# Patient Record
Sex: Female | Born: 1950 | Race: Black or African American | Hispanic: No | Marital: Married | Smoking: Never smoker
Health system: Southern US, Community
[De-identification: ages and names within clinical notes are randomized; demographics above are authoritative.]

## PROBLEM LIST (undated history)

## (undated) DIAGNOSIS — G43909 Migraine, unspecified, not intractable, without status migrainosus: Secondary | ICD-10-CM

## (undated) DIAGNOSIS — E7800 Pure hypercholesterolemia, unspecified: Secondary | ICD-10-CM

## (undated) DIAGNOSIS — E119 Type 2 diabetes mellitus without complications: Secondary | ICD-10-CM

## (undated) HISTORY — PX: ABDOMINAL HYSTERECTOMY: SHX81

## (undated) HISTORY — PX: CERVICAL DISC SURGERY: SHX588

## (undated) HISTORY — DX: Migraine, unspecified, not intractable, without status migrainosus: G43.909

## (undated) HISTORY — DX: Type 2 diabetes mellitus without complications: E11.9

## (undated) HISTORY — DX: Pure hypercholesterolemia, unspecified: E78.00

---

## 1999-03-22 ENCOUNTER — Encounter: Admission: RE | Admit: 1999-03-22 | Discharge: 1999-03-22 | Payer: Self-pay | Admitting: Family Medicine

## 1999-03-22 ENCOUNTER — Encounter: Payer: Self-pay | Admitting: Family Medicine

## 1999-05-29 ENCOUNTER — Encounter: Payer: Self-pay | Admitting: Neurosurgery

## 1999-05-31 ENCOUNTER — Inpatient Hospital Stay (HOSPITAL_COMMUNITY): Admission: RE | Admit: 1999-05-31 | Discharge: 1999-06-04 | Payer: Self-pay | Admitting: Neurosurgery

## 1999-05-31 ENCOUNTER — Encounter: Payer: Self-pay | Admitting: Neurosurgery

## 1999-06-02 ENCOUNTER — Encounter: Payer: Self-pay | Admitting: Neurosurgery

## 1999-06-15 ENCOUNTER — Encounter: Admission: RE | Admit: 1999-06-15 | Discharge: 1999-06-15 | Payer: Self-pay | Admitting: Neurosurgery

## 1999-06-15 ENCOUNTER — Encounter: Payer: Self-pay | Admitting: Neurosurgery

## 1999-08-24 ENCOUNTER — Encounter: Payer: Self-pay | Admitting: Neurosurgery

## 1999-08-24 ENCOUNTER — Encounter: Admission: RE | Admit: 1999-08-24 | Discharge: 1999-08-24 | Payer: Self-pay | Admitting: Neurosurgery

## 2001-05-06 ENCOUNTER — Encounter: Payer: Self-pay | Admitting: Family Medicine

## 2001-05-06 ENCOUNTER — Encounter: Admission: RE | Admit: 2001-05-06 | Discharge: 2001-05-06 | Payer: Self-pay | Admitting: Family Medicine

## 2001-07-15 ENCOUNTER — Encounter: Payer: Self-pay | Admitting: Family Medicine

## 2001-07-15 ENCOUNTER — Encounter: Admission: RE | Admit: 2001-07-15 | Discharge: 2001-07-15 | Payer: Self-pay | Admitting: Family Medicine

## 2002-04-13 ENCOUNTER — Other Ambulatory Visit: Admission: RE | Admit: 2002-04-13 | Discharge: 2002-04-13 | Payer: Self-pay | Admitting: Diagnostic Radiology

## 2002-10-27 ENCOUNTER — Encounter: Admission: RE | Admit: 2002-10-27 | Discharge: 2002-10-27 | Payer: Self-pay | Admitting: Family Medicine

## 2002-10-27 ENCOUNTER — Encounter: Payer: Self-pay | Admitting: Family Medicine

## 2002-12-01 ENCOUNTER — Encounter: Payer: Self-pay | Admitting: Family Medicine

## 2002-12-01 ENCOUNTER — Encounter: Admission: RE | Admit: 2002-12-01 | Discharge: 2002-12-01 | Payer: Self-pay | Admitting: Family Medicine

## 2002-12-01 ENCOUNTER — Encounter: Admission: RE | Admit: 2002-12-01 | Discharge: 2003-03-01 | Payer: Self-pay | Admitting: Family Medicine

## 2003-01-17 ENCOUNTER — Ambulatory Visit (HOSPITAL_COMMUNITY): Admission: RE | Admit: 2003-01-17 | Discharge: 2003-01-17 | Payer: Self-pay | Admitting: Gastroenterology

## 2003-10-07 ENCOUNTER — Other Ambulatory Visit: Admission: RE | Admit: 2003-10-07 | Discharge: 2003-10-07 | Payer: Self-pay | Admitting: Diagnostic Radiology

## 2004-04-12 ENCOUNTER — Encounter: Admission: RE | Admit: 2004-04-12 | Discharge: 2004-04-12 | Payer: Self-pay | Admitting: Family Medicine

## 2004-05-14 ENCOUNTER — Encounter (INDEPENDENT_AMBULATORY_CARE_PROVIDER_SITE_OTHER): Payer: Self-pay | Admitting: *Deleted

## 2004-05-14 ENCOUNTER — Ambulatory Visit (HOSPITAL_COMMUNITY): Admission: RE | Admit: 2004-05-14 | Discharge: 2004-05-15 | Payer: Self-pay | Admitting: General Surgery

## 2004-07-30 ENCOUNTER — Encounter (HOSPITAL_COMMUNITY): Admission: RE | Admit: 2004-07-30 | Discharge: 2004-10-28 | Payer: Self-pay | Admitting: Endocrinology

## 2004-10-26 ENCOUNTER — Encounter: Admission: RE | Admit: 2004-10-26 | Discharge: 2004-10-26 | Payer: Self-pay | Admitting: Endocrinology

## 2004-11-30 ENCOUNTER — Encounter: Admission: RE | Admit: 2004-11-30 | Discharge: 2004-11-30 | Payer: Self-pay | Admitting: Endocrinology

## 2005-07-16 ENCOUNTER — Encounter: Admission: RE | Admit: 2005-07-16 | Discharge: 2005-07-16 | Payer: Self-pay | Admitting: Family Medicine

## 2005-08-14 ENCOUNTER — Encounter: Admission: RE | Admit: 2005-08-14 | Discharge: 2005-08-14 | Payer: Self-pay | Admitting: Endocrinology

## 2006-03-12 IMAGING — NM NM RAI THERAPY CANCER THYROID
1 series · 1 of 1 positions shown · non-contrast
Comparison: none

CLINICAL DATA: 53 year old; thyroid cancer.
 NUCLEAR MEDICINE RADIOACTIVE IODINE THERAPY FOR THYROID CANCER:
 The patient has a history of papillary thyroid cancer and had a complete thyroidectomy on 05/14/04.  Whole body D-7S7 scan showed residual activity in the thyroid bed.  No metastatic disease.  The patient was subsequently treated with 100 millicuries of D-7S7 orally.  Appropriate precautions and instructions were reviewed with the patient.   The patient is to return for a 10 day post-treatment follow-up whole body D-7S7 scan.

[Series 1: st static image · 1.61mm/px · 1 of 1 slices shown]
[im 1/1]
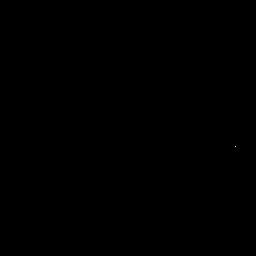

[1 of 1 positions shown; findings below may reference images not displayed]

IMPRESSION: Treatment for thyroid cancer with 100 millicuries of D-7S7.

## 2006-08-20 ENCOUNTER — Encounter: Admission: RE | Admit: 2006-08-20 | Discharge: 2006-08-20 | Payer: Self-pay | Admitting: Endocrinology

## 2006-12-17 ENCOUNTER — Encounter: Admission: RE | Admit: 2006-12-17 | Discharge: 2006-12-17 | Payer: Self-pay | Admitting: Family Medicine

## 2007-07-29 ENCOUNTER — Encounter: Admission: RE | Admit: 2007-07-29 | Discharge: 2007-07-29 | Payer: Self-pay | Admitting: Family Medicine

## 2008-04-14 ENCOUNTER — Encounter: Admission: RE | Admit: 2008-04-14 | Discharge: 2008-04-14 | Payer: Self-pay | Admitting: Family Medicine

## 2008-12-22 ENCOUNTER — Encounter: Admission: RE | Admit: 2008-12-22 | Discharge: 2008-12-22 | Payer: Self-pay | Admitting: Otolaryngology

## 2009-07-31 ENCOUNTER — Telehealth: Payer: Self-pay | Admitting: Internal Medicine

## 2009-08-29 ENCOUNTER — Encounter: Admission: RE | Admit: 2009-08-29 | Discharge: 2009-08-29 | Payer: Self-pay | Admitting: Family Medicine

## 2009-09-12 ENCOUNTER — Encounter: Admission: RE | Admit: 2009-09-12 | Discharge: 2009-09-12 | Payer: Self-pay | Admitting: Gastroenterology

## 2010-04-29 ENCOUNTER — Encounter: Payer: Self-pay | Admitting: Endocrinology

## 2010-05-10 NOTE — Progress Notes (Signed)
  Phone Note Outgoing Call   Call placed by: Milford Cage NCMA,  July 31, 2009 11:12 AM Call placed to: Patient Summary of Call: Called patient to find out why she was switching from Dr. Ewing Schlein and she states that her PCP referred her to [place]. She wants to stay with Dr. Ewing Schlein and will have Dr. Malen Gauze office call and make an appt. with him.  Appt. was cxl'd for 08/03/09 Milford Cage NCMA  July 31, 2009 11:13 AM

## 2010-08-24 NOTE — Op Note (Signed)
   Natalie Vincent, Natalie Vincent                 ACCOUNT NO.:  [id number]   MEDICAL RECORD NO.:  [id number]                   PATIENT TYPE:  AMB   LOCATION:  ENDO                                 FACILITY:  [facility]   PHYSICIAN:  Petra Kuba, M.D.                 DATE OF BIRTH:  1951-03-22   DATE OF PROCEDURE:  01/17/2003  DATE OF DISCHARGE:                                 OPERATIVE REPORT   PROCEDURE:  Colonoscopy.   INDICATIONS:  Patient with multiple lower GI complaints, due for screening.  Consent was signed after risks, benefits, methods, and options thoroughly  discussed in the office.   MEDICINES USED:  Demerol 70 mg, Versed 8 mg.   DESCRIPTION OF PROCEDURE:  Rectal inspection pertinent for external  hemorrhoids, small.  Digital exam was negative.  The pediatric video  adjustable colonoscope was inserted, easily advanced to the level of the  splenic flexure.  Unfortunately, at that point the scope began to loop and  with rolling her on her back and various abdominal pressures, we were able  to advance to the cecum.  No obvious abnormality was seen on insertion.  The  cecum was identified by the appendiceal orifice and the ileocecal valve.  I  fact, the scope was advanced into the opening of the terminal ileum but  could not be advanced very deep into it.  A quick look there appeared  normal.  The scope was slowly withdrawn.  The prep was adequate.  There was  some liquid stool that required washing and suctioning but on slow  withdrawal through the colon, no polyps, tumors, masses, signs of colitis,  or diverticula were seen as we slowly withdrew back to the rectum.  Anorectal pull-through and retroflexion confirmed some small hemorrhoids.  The scope was inserted a short way up the left side of the colon, air was  suctioned, the scope removed.  The patient tolerated the procedure well.  There was no obvious immediate complication.   ENDOSCOPIC DIAGNOSES:  1.  Internal-external hemorrhoids.  2. Otherwise within normal limits to the terminal ileum.   PLAN:  Follow up p.r.n.  Yearly rectals and guaiacs per Dr. Modesto Charon.  Repeat  screening in five to 10 years.  Consideration of Miralax and antispasmodics  for her symptoms if they continue.  Possibly a good cleaning and reassurance  will help.                                               Petra Kuba, M.D.    MEM/MEDQ  D:  01/17/2003  T:  01/17/2003  Job:  [id number]   cc:   Thelma Barge P. Modesto Charon, M.D.  [address]  [place]  [place] [address]  Fax: [phone]

## 2010-08-24 NOTE — Discharge Summary (Signed)
[place]  Patient:    Natalie, Vincent                       MRN: [id number] Adm. Date:  [id number] Disc. Date: [id number] Attending:  Josie Saunders                           Discharge Summary  REASON FOR ADMISSION:  Cervical disk herniation with myelopathy, with cervical spinal stenosis and cervical myelopathy.  DISCHARGE DIAGNOSES:  Cervical disk herniation with myelopathy, with cervical spinal stenosis and cervical myelopathy.  Additionally, myalgias and myositis with goiter, dysphagia, hypoxia with postoperative nausea.  HISTORY OF ILLNESS AND HOSPITAL COURSE:  [name] is a 60 year old woman with cervical spondylosis and neck pain and stiffness, with upper extremity dysesthesias.  She was found to have cervical spondylosis and disk herniations with cervical spinal stenosis, most marked at C4-5, C5-6, and C6-7 levels. The patient was admitted to the hospital on same day as procedure basis on May 31, 1999, and underwent anterior cervical diskectomy and fusion with allograft bone grafting and anterior cervical plating at C4-5, C5-6, and C6-7 levels.  She did well with her surgery, although postoperatively she had interscapular and incisional pain.  She had a cough on February 23 and had mild upper respiratory and viral syndrome prior to surgery.  She had some mild hand intrinsic weakness on the right.  She was mobilized as tolerated.  On February 24, she noted some difficulty swallowing, and had an oxygen saturation of 89% on room air.  She was therefore put on supplemental oxygen with good oxygen saturation.  She had some postoperative nausea as well as dysphagia.  On February 25, the nausea was improved.  She had a chest x-ray which showed a minimal effusion.  She still had marginal saturations at 91% on room air.  She was having some dysuria and frequent urination, and was started on ciprofloxacin for urinary tract infection.   She was observed on February 25, and on February 26 was doing better.  She complained of a sore throat but her difficulty with swallowing appeared to be improved at that point.  Her saturation on room air at that point was 93%.  She was doing better and was felt to be in stable condition, and was discharged home.  DISCHARGE MEDICATIONS:  Include her preoperative medications along with: 1. Percocet 1 or 2 every four hours as needed for pain. 2. Valium 5 mg up to every six hours as needed for spasm. 3. Stool softener as needed.  ACTIVITY:  She was instructed to wear her collar, to engage in no heavy lifting, pulling, or driving.  Okay to shower, but not to soak her incision.  FOLLOW-UP:  In two weeks with Dr. Venetia Maxon, and call today for an appointment. DD:  06/29/99 TD:  06/29/99 Job: [id number]

## 2010-08-24 NOTE — Op Note (Signed)
NAMEJEANNIFER, DRAKEFORD     ACCOUNT NO.:  [id number]   MEDICAL RECORD NO.:  [id number]          PATIENT TYPE:  OIB   LOCATION:  NA                           FACILITY:  [facility]   PHYSICIAN:  Leonie Man, M.D.   DATE OF BIRTH:  03-28-1951   DATE OF PROCEDURE:  05/14/2004  DATE OF DISCHARGE:                                 OPERATIVE REPORT   PREOPERATIVE DIAGNOSIS:  Left thyroid nodule.   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   PROCEDURE:  Total thyroidectomy.   SURGEON:  Leonie Man, M.D.   ASSISTANT:  Gita Kudo, M.D.   ANESTHESIA:  General.   INDICATIONS FOR PROCEDURE:  The patient is a 60 year old woman who presents  with a nodule of the left thyroid. She underwent fine-needle aspiration  biopsy of this lesion which showed a follicular lesion, could not rule out  carcinoma. She comes to the operating room after the risks and potential  benefits of surgery had been fully discussed including the risks of  recurrent laryngeal nerve injury, hyperparathyroidism, bleeding and  infection and been fully discussed. All questions answered and consent  obtained. The patient is aware that total thyroidectomy is possible if  indeed she does have cancer of thyroid. She gives consent to surgery after  all questions have been answered.   PROCEDURE:  Following induction of satisfactory general anesthesia with the  patient positioned supinely, the neck was prepped and draped to be included  in the sterile operative field. A collar incision was made approximately two  fingerbreadths above the sternal notch, deepening this through skin and  subcutaneous tissue and transecting the platysma muscle from the anterior  border of the sternocleidomastoid muscles bilaterally. Superior flap was  raised up to the thyroid cartilage, inferior flaps carried down to the  sternal notch and the strap muscles are opened in the midline and retracted.  The  left thyroid lobe is rather large and bulky. This was mobilized from the  posterior recesses of the neck taking the middle thyroid vein. The inferior  pole of the thyroid is elevated and the lower pole vessels are taken between  clips and secured. Attention then turned to the upper pole where the upper  pole was then taken down and secured with ties of 2-0 silk. The thyroid was  then retracted medially to expose the region of the recurrent laryngeal  nerves. Thyroid was then dissected free from the neck maintaining hemostasis  with clips and electrocautery where appropriate, protecting the recurrent  laryngeal nerve throughout its entire course. One parathyroid gland was  definitely identified. I did not identify any parathyroid glands either on  the thyroid or other parathyroid glands in the neck. The thyroid was then  dissected free from off of the anterior portion of the trachea taking the  pyramidal and the isthmus over towards the left middle lobe.  The isthmus  was then transected between clamps and then secured with suture ligatures of  3-0 Vicryl. Frozen section was obtained on the left lobe which showed a 1.3  cm papillary carcinoma. Decision was then made to proceed with total  thyroidectomy.  The incision which had already been closed prior to return of  the frozen section was reopened and the right thyroid gland was then  exposed. Both the middle thyroid vein was taken. The inferior thyroid  vessels were noted and taken between clips. The upper pole was taken and  secured with ties of 2-0 silk. The recurrent laryngeal nerve on this side  and two parathyroid glands on the right side were both positively  identified. These were spared and the thyroid was dissected free from the  recurrent nerve. This was removed from off of the anterior trachea and  forwarded for permanent section. The wound was then thoroughly irrigated  with normal saline. Sponge, instrument and sharp counts were  verified. All  areas of dissection checked for hemostasis and noted to be dry. The wound  was then closed in layers as follows. The strap muscles closed in the  midline with a running 3-0 Vicryl suture. Platysma muscle closed with  interrupted 3-0 Vicryl sutures. The skin was closed with 5-0 Monocryl suture  and then reinforced with Steri-Strips. Sterile dressings applied. Anesthetic  reversed. The patient removed from the operating room to the recovery room  in stable condition. She tolerated the procedure well.      PB/MEDQ  D:  05/14/2004  T:  05/14/2004  Job:  [id number]   cc:   Maryla Morrow. Modesto Charon, M.D.  [address]  [place]  [place] [address]  Fax: [phone]

## 2010-08-24 NOTE — H&P (Signed)
[place]. [facility]  Patient:    Natalie Vincent, Natalie Vincent              MRN: [id number] Adm. Date:  05/31/99 Attending:  Danae Orleans. Venetia Maxon, M.D.                         History and Physical  CHIEF COMPLAINT:  Cervical spinal stenosis and cervical myelopathy.  HISTORY OF PRESENT ILLNESS:  Janijah Symons was initially seen by Dr. Corlis Hove on April 12, 1999.  She is a 60 year old right handed woman een in neurosurgical consultation because of neck stiffness and pain associated with extremity dysesthesias.  The patient states she has probably been having these symptoms for about a year but over the past five months has noticed increased difficulties with a cracking sensation in her neck with movement.  She also describes intermittent dysesthesias into the arms and legs.  Sometimes this is n the left and sometimes on the right.  She does have some low back discomfort. er neck is stiff, as is her low back, when she gets up in the morning.  She has also had occasional gait unsteadiness.  She describes some feelings of numbness diffusely associated with tingling in both hands.  The right arm (biceps by description) seems weak at times.  She states that if she coughs she gets a vibration-like sensation in her arms and legs.  She does not describe lightening-like sensations up and down her spine.  She has no cranial symptoms except that she had some occasional rapid blinking of her left eyelid in the past and was told by her personal physician that this was related to not getting enough REM sleep.  The patient does not have any bowel or bladder dysfunction.  The patient brought plain cervical spine x-rays with her which showed spondylitic change at C3-4, C4-5, and C5-6.  The changes are most noticeable at C4-5 and C5-6. She has worsening interspace narrowing at those levels.  She also had an MRI of her cervical spine with her which shows  significant spinal stenosis beginning at C3-4 and extending through C6-7.  This is most severe at the C5-6 level.  Central canal stenosis is relatively mild at C3-4 and C6-7 levels and is moderate at C4-5. On some of the views there appeared to be some cord signal change but it was not positively certain of that.  PAST MEDICAL HISTORY:  The patient has been diagnosed over the past few months ith having fibromyalgia on the basis of her above noted symptoms.  She also was started on some Elavil, which actually seems to have improved the situation.  PAST SURGICAL HISTORY:  She had cesarean sections in 1979 and 1981.  She had an  appendectomy in 1979 and removal of uterine fibroid.  She had a hysterectomy in  1985.  ALLERGIES:  No known drug allergies.  SOCIAL HISTORY:  She does not smoke.  She drinks alcohol socially.  There is no  history of substance abuse.  She is married and has two grown children, ages 45 and 74.  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  FAMILY HISTORY:  Her mother died in her mid-50s and had diabetes and associated  kidney and eye involvement.  Her father died around age 16 with emphysema and known cancer.  She has multiple half siblings, several of which have spine problems.   REVIEW OF SYSTEMS:  She has had  some blood in her urine and has been evaluated y a urologist but at this point no etiology has been found for this.  The patient has been told in the past that she has a goiter.  She has some occasional sinus headaches and nasal congestion.  She does need to wear glasses and has some occasional tinnitus.  Otherwise Review Of Systems noncontributory and already discussed in the current history as noted above.  CURRENT MEDICATIONS: 1.  Elavil once a day for fibromyalgia. 2.  Premarin.  PHYSICAL EXAMINATION:  GENERAL:  The patient is a well-developed, well-nourished female who looks much  younger than  her stated age.  VITAL SIGNS:  Weight 211 pounds.  Height 5 feet 6 inches.  HEENT:  Negative.  NECK:  See below.  Could not palpate a significant goiter.  LUNGS:  Clear.  Good intercostal function.  HEART:  Sinus rhythm without murmurs.  ABDOMEN:  Soft, nontender, protuberant secondary to exogenous obesity.  Well healed midline lower abdominal scar.  EXTREMITIES:  See below.  I could not detect any tone abnormalities and no atrophy or fasciculations seen.  She has a four inch scar on the posterior aspect of the left mid calf secondary to an injury she sustained as a child climbing over a barbed wire fence.  There are good pulses in her feet.  NEUROLOGIC:  The patient is alert and general oriented and cooperative.  Pupils are small, equal, round, and reactive to light and accommodation.  EOMI.  Discs are  full.  There is no ptosis and no evidence of Horners syndrome.  She has no facial paresis.  She has an excellent attention span and is quite intelligent.  There s no evidence of any cranial nerve dysfunction.  She ambulates satisfactorily and can do tandem walking well.  Heel walking may not be done quite as well on the right as on the left but the asymmetry is minimal.  She bends forward 90 degrees with back extension negative.  She has mild limitation of neck extension but no true Lhermittes phenomenon or radicular pain with extremities and neck movement. Flexion is quite uncomfortable for her and lateral rotation results in a crackling sensation in her back.  No Tinels sign at either wrist.  Strength is normal to direct testing in her upper and lower extremities although I thought the right thenar eminence had some mild softening compared to the left.  Joint position and pinprick are intact.  Abdominals are 2+ and symmetric.  Superficial abdominals re absent in all four quadrants.  Deep tendon reflexes on the right, brachial radialis 1 to 2+, biceps 1 to 2+,  triceps 1 to 2+, knee jerks 2 to 3+, ankle jerk 1 to 2+ on the left, brachial radialis 2+, biceps 1 to 2+, triceps 2 to 3+, knee jerk 3+, ankle jerk 1+.  Babinski responses are plantar bilaterally.  No ankle clonus.  IMPRESSION:  1.  Initial impression is of multi-level cervical spondylosis associated with     spinal cord compression, worse at C5-6 with probable myelopathy. 2.  History of goiter. 3.  Probable lumbar spondylosis. 4.  History of hematuria, etiology unknown. 5.  History of possible fibromyalgia.  RECOMMENDATIONS:  The clinical situation was discussed in detail with the patient. She has had an EMG of her upper extremity and has subsequently been seen in consultation by me.  I saw her on April 20, 1999 and at that time I had the opportunity to meet with Gaetana Michaelis  at the request of Dr. Roxan Hockey. He had previously evaluated her on April 12, 1999 and was concerned with her significant spinal cord compression and spinal stenosis at multiple levels in her cervical spine and felt that she had evidence on her examination and also by history of Lhermittes sign with cervical spondylitic myelopathy.  I went over her previous records and then met with her and her husband and discussed her situation with her at some length.  I concur with Dr. Elder Negus evaluation.  She had loss of cervical lordosis and significant spinal stenosis n her cervical MRI with most marked spinal cord compression at C5-6, and to a lesser degree at the C4-5 level.  She also has some evidence of spinal stenosis and spondylitic disease at C6-7, left greater than right, and to a lesser degree at the C3-4 level.  Her examination is consistent with cervical spondylitic myelopathy as well as a  complaint of intermittent shock-like feelings in her right arm and leg greater han right side with cough or sneeze, which sound to be consistent with Lhermittes phenomenon.  She notes numbness and  weakness in her right hand in particular and to my examination she has significant biceps weakness at 4/5 as well as hand intrinsic weakness and finger extensor weakness on the right.  I did not detect much significant left upper extremity weakness although she did have some mild hand intrinsic weakness on the left.  Ms. Ng has complaints of lower extremity numbness and stiffness, particularly with standing for long periods of time.  There was some debate between her and her husband as to whether she has had these problems and pain in her neck for eight months as she initially felt, but her husband said he thought she had  been complaining of these problems for about three years.  She also notes frequent headaches.  These do not seem to be of an occipital origin but rather she complains of some retro-orbital pain, and I told her I was not certain that these were from her neck pathology.  She was felt initially by Dr. Modesto Charon to have fibromyalgia but I said that certainly her symptoms of numbness, stiffness, and pain in her upper nd lower extremities as well as weakness could be explained by her cervical spinal  cord compression.  I went over Ms. Napoleon-Hangers studies with her and her husband in great detail. We discussed treatment options and I told her that I would be uncomfortable leaving a significant degree of cervical spinal stenosis without treatment and I have recommended that she undergo anterior cervical diskectomy and fusion at C4-5, C5-6, and C6-7 levels with allograft bone grafting and anterior cervical plating.  I ent over models of the proposed surgical intervention and discussed the typical hospital and post hospital course as well as potential risks which include, but are not limited to, bleeding, infection, damage to nerves and vessels, failure to relieve pain and weakness, paralysis, damage to recurrent laryngeal nerve with resultant  hoarseness of voice, difficulty swallowing and esophageal perforation, stroke, need for further surgery.  She understands these risks and wishes to consider options with her husband.  I told her that she is at risk of paralysis in an accident or fall given the severity of her spinal stenosis.  She considered er options and subsequently contacted me and wished to go ahead with surgery.  The  surgery was set up for May 31, 1999. DD:  05/31/99 TD:  05/31/99 Job: [id number]

## 2011-01-30 ENCOUNTER — Emergency Department (HOSPITAL_COMMUNITY): Payer: BC Managed Care – PPO

## 2011-01-30 ENCOUNTER — Emergency Department (HOSPITAL_COMMUNITY)
Admission: EM | Admit: 2011-01-30 | Discharge: 2011-01-30 | Disposition: A | Discharge status: Home / Self Care | Payer: BC Managed Care – PPO | Attending: Emergency Medicine | Admitting: Emergency Medicine

## 2011-01-30 DIAGNOSIS — R55 Syncope and collapse: Secondary | ICD-10-CM | POA: Insufficient documentation

## 2011-01-30 DIAGNOSIS — Z79899 Other long term (current) drug therapy: Secondary | ICD-10-CM | POA: Insufficient documentation

## 2011-01-30 DIAGNOSIS — K219 Gastro-esophageal reflux disease without esophagitis: Secondary | ICD-10-CM | POA: Insufficient documentation

## 2011-01-30 DIAGNOSIS — E7800 Pure hypercholesterolemia, unspecified: Secondary | ICD-10-CM | POA: Insufficient documentation

## 2011-01-30 LAB — DIFFERENTIAL
Basophils Absolute: 0 10*3/uL (ref 0.0–0.1)
Basophils Relative: 0 % (ref 0–1)
Eosinophils Absolute: 0.1 10*3/uL (ref 0.0–0.7)
Eosinophils Relative: 1 % (ref 0–5)
Lymphocytes Relative: 34 % (ref 12–46)
Lymphs Abs: 3.4 10*3/uL (ref 0.7–4.0)
Monocytes Absolute: 0.6 10*3/uL (ref 0.1–1.0)
Monocytes Relative: 6 % (ref 3–12)
Neutro Abs: 5.9 10*3/uL (ref 1.7–7.7)
Neutrophils Relative %: 58 % (ref 43–77)

## 2011-01-30 LAB — URINALYSIS, ROUTINE W REFLEX MICROSCOPIC
Bilirubin Urine: NEGATIVE
Glucose, UA: NEGATIVE mg/dL
Ketones, ur: NEGATIVE mg/dL
Leukocytes, UA: NEGATIVE
Nitrite: NEGATIVE
Protein, ur: NEGATIVE mg/dL
Specific Gravity, Urine: 1.012 (ref 1.005–1.030)
Urobilinogen, UA: 0.2 mg/dL (ref 0.0–1.0)
pH: 7 (ref 5.0–8.0)

## 2011-01-30 LAB — CBC
HCT: 40.7 % (ref 36.0–46.0)
Hemoglobin: 13 g/dL (ref 12.0–15.0)
MCH: 26.5 pg (ref 26.0–34.0)
MCHC: 31.9 g/dL (ref 30.0–36.0)
MCV: 83.1 fL (ref 78.0–100.0)
Platelets: 291 10*3/uL (ref 150–400)
RBC: 4.9 MIL/uL (ref 3.87–5.11)
RDW: 14.9 % (ref 11.5–15.5)
WBC: 10 10*3/uL (ref 4.0–10.5)

## 2011-01-30 LAB — URINE MICROSCOPIC-ADD ON

## 2011-01-30 LAB — POCT I-STAT, CHEM 8
BUN: 14 mg/dL (ref 6–23)
Calcium, Ion: 1.18 mmol/L (ref 1.12–1.32)
Chloride: 101 mEq/L (ref 96–112)
Creatinine, Ser: 1.1 mg/dL (ref 0.50–1.10)
Glucose, Bld: 157 mg/dL — ABNORMAL HIGH (ref 70–99)
HCT: 42 % (ref 36.0–46.0)
Hemoglobin: 14.3 g/dL (ref 12.0–15.0)
Potassium: 3.5 mEq/L (ref 3.5–5.1)
Sodium: 139 mEq/L (ref 135–145)
TCO2: 28 mmol/L (ref 0–100)

## 2011-01-30 LAB — POCT I-STAT TROPONIN I
Troponin i, poc: 0.02 ng/mL (ref 0.00–0.08)
Troponin i, poc: 0 ng/mL (ref 0.00–0.08)

## 2011-01-30 LAB — GLUCOSE, CAPILLARY: Glucose-Capillary: 156 mg/dL — ABNORMAL HIGH (ref 70–99)

## 2011-12-12 ENCOUNTER — Other Ambulatory Visit: Payer: Self-pay | Admitting: Family Medicine

## 2011-12-12 DIAGNOSIS — Z1231 Encounter for screening mammogram for malignant neoplasm of breast: Secondary | ICD-10-CM

## 2011-12-19 ENCOUNTER — Ambulatory Visit
Admission: RE | Admit: 2011-12-19 | Discharge: 2011-12-19 | Disposition: A | Discharge status: Home / Self Care | Payer: BC Managed Care – PPO | Source: Ambulatory Visit | Attending: Family Medicine | Admitting: Family Medicine

## 2011-12-19 DIAGNOSIS — Z1231 Encounter for screening mammogram for malignant neoplasm of breast: Secondary | ICD-10-CM

## 2011-12-20 ENCOUNTER — Other Ambulatory Visit: Payer: Self-pay | Admitting: Family Medicine

## 2011-12-20 DIAGNOSIS — R928 Other abnormal and inconclusive findings on diagnostic imaging of breast: Secondary | ICD-10-CM

## 2011-12-25 ENCOUNTER — Ambulatory Visit
Admission: RE | Admit: 2011-12-25 | Discharge: 2011-12-25 | Disposition: A | Discharge status: Home / Self Care | Payer: BC Managed Care – PPO | Source: Ambulatory Visit | Attending: Family Medicine | Admitting: Family Medicine

## 2011-12-25 DIAGNOSIS — R928 Other abnormal and inconclusive findings on diagnostic imaging of breast: Secondary | ICD-10-CM

## 2012-11-19 ENCOUNTER — Other Ambulatory Visit: Payer: Self-pay

## 2012-11-19 DIAGNOSIS — Z1231 Encounter for screening mammogram for malignant neoplasm of breast: Secondary | ICD-10-CM

## 2012-12-22 ENCOUNTER — Ambulatory Visit
Admission: RE | Admit: 2012-12-22 | Discharge: 2012-12-22 | Disposition: A | Discharge status: Home / Self Care | Payer: BC Managed Care – PPO | Source: Ambulatory Visit

## 2012-12-22 DIAGNOSIS — Z1231 Encounter for screening mammogram for malignant neoplasm of breast: Secondary | ICD-10-CM

## 2012-12-23 ENCOUNTER — Other Ambulatory Visit: Payer: Self-pay | Admitting: Family Medicine

## 2012-12-23 DIAGNOSIS — N644 Mastodynia: Secondary | ICD-10-CM

## 2013-01-11 ENCOUNTER — Ambulatory Visit
Admission: RE | Admit: 2013-01-11 | Discharge: 2013-01-11 | Disposition: A | Discharge status: Home / Self Care | Payer: BC Managed Care – PPO | Source: Ambulatory Visit | Attending: Family Medicine | Admitting: Family Medicine

## 2013-01-11 ENCOUNTER — Other Ambulatory Visit: Payer: Self-pay | Admitting: Family Medicine

## 2013-01-11 DIAGNOSIS — N644 Mastodynia: Secondary | ICD-10-CM

## 2013-08-10 ENCOUNTER — Other Ambulatory Visit: Payer: Self-pay | Admitting: Family Medicine

## 2013-08-10 DIAGNOSIS — R921 Mammographic calcification found on diagnostic imaging of breast: Secondary | ICD-10-CM

## 2013-08-20 ENCOUNTER — Ambulatory Visit
Admission: RE | Admit: 2013-08-20 | Discharge: 2013-08-20 | Disposition: A | Discharge status: Home / Self Care | Payer: BC Managed Care – PPO | Source: Ambulatory Visit | Attending: Family Medicine | Admitting: Family Medicine

## 2013-08-20 DIAGNOSIS — R921 Mammographic calcification found on diagnostic imaging of breast: Secondary | ICD-10-CM

## 2013-09-10 ENCOUNTER — Other Ambulatory Visit: Payer: Self-pay

## 2013-09-10 DIAGNOSIS — Z1231 Encounter for screening mammogram for malignant neoplasm of breast: Secondary | ICD-10-CM

## 2013-09-15 ENCOUNTER — Encounter: Payer: Self-pay | Admitting: Neurology

## 2013-09-15 ENCOUNTER — Ambulatory Visit (INDEPENDENT_AMBULATORY_CARE_PROVIDER_SITE_OTHER): Payer: BC Managed Care – PPO | Admitting: Neurology

## 2013-09-15 ENCOUNTER — Encounter (INDEPENDENT_AMBULATORY_CARE_PROVIDER_SITE_OTHER): Payer: Self-pay

## 2013-09-15 VITALS — BP 126/71 | HR 72 | Temp 97.7°F | Ht 66.0 in | Wt 218.0 lb

## 2013-09-15 DIAGNOSIS — R51 Headache: Secondary | ICD-10-CM

## 2013-09-15 DIAGNOSIS — G4733 Obstructive sleep apnea (adult) (pediatric): Secondary | ICD-10-CM

## 2013-09-15 DIAGNOSIS — M545 Low back pain, unspecified: Secondary | ICD-10-CM

## 2013-09-15 DIAGNOSIS — R351 Nocturia: Secondary | ICD-10-CM

## 2013-09-15 DIAGNOSIS — E669 Obesity, unspecified: Secondary | ICD-10-CM

## 2013-09-15 DIAGNOSIS — R519 Headache, unspecified: Secondary | ICD-10-CM

## 2013-09-15 NOTE — Patient Instructions (Signed)

## 2013-09-15 NOTE — Progress Notes (Signed)
Subjective:    Patient ID: Natalie Vincent is a 63 y.o. female.  HPI    Natalie FoleySaima Yehudah Standing, MD, PhD [facility] [address], [address] [address] [place], [place] [id number]    Dear Dr. Leavy Vincent,   I saw your patient, Natalie Vincent, upon your kind request in my neurological clinic today for initial consultation of her sleep disorder, in particular daytime somnolence in the context of snoring and witnessed apneas, concern for sleep apnea. The patient is unaccompanied today. As you, Ms. Natalie Vincent is a very pleasant 63 year old right-handed woman with an underlying medical history of allergic rhinitis, type 2 diabetes, reflux disease, hyperlipidemia, vitamin D deficiency, hypo-thyroidism, s/p thyroidectomy for thyroid cancer, HE and TE, and obesity, who reports snoring, witnessed apneas per husband's report, multiple night time awakenings, nocturia x 3-4/night, daytime somnolence and recurrent morning headaches. Her ESS is 14/24 today. She has been snoring for years, but has been this sleepy for the last year and her weight has been fluctuating. She has some mild RLS symptoms, but is not aware of any leg kicking. She wakes up with a HA at least 2 times per week. Her snoring can be mild to loud and she has woken herself up with a sense of gasping.  Her typical bedtime is reported to be around 11 PM and usual wake time is around 6:30 AM. Sleep onset typically occurs within minutes. She reports feeling marginally rested upon awakening. She wakes up on an average 4 times in the middle of the night. There is report of nighttime reflux, with occasional nighttime cough experienced. There is no family history of RLS or OSA.  She is a restless sleeper and in the morning, the bed is quite disheveled. She has LBP and sciatica to the L and cannot sleep on the back, but also not consistently on one side.  She denies cataplexy, sleep paralysis, hypnagogic or hypnopompic  hallucinations, or sleep attacks. She does not report any vivid dreams, nightmares, dream enactments, or parasomnias, such as sleep talking or sleep walking. The patient has not had a sleep study or a home sleep test.  She consumes 2 caffeinated beverages per day, usually in the form of coffee.  Her bedroom is usually dark and cool. There is a TV in the bedroom and usually it is on at night. Occasionally, her daughter's dog is in the bed with them.   Her Past Medical History Is Significant For: Past Medical History  Diagnosis Date  . Diabetes   . Migraine   . Hypercholesteremia     Her Past Surgical History Is Significant For: Past Surgical History  Procedure Laterality Date  . Cervical disc surgery      titanium rod  . Abdominal hysterectomy      Her Family History Is Significant For: Family History  Problem Relation Age of Onset  . Cancer Brother   . Cancer Sister   . Cancer Father   . Cancer Paternal Uncle   . Diabetes Mother   . Kidney failure Mother     Her Social History Is Significant For: History   Social History  . Marital Status: Married    Spouse Name: N/A    Number of Children: N/A  . Years of Education: N/A   Social History Main Topics  . Smoking status: Never Smoker   . Smokeless tobacco: None  . Alcohol Use: No  . Drug Use: No  . Sexual Activity: None   Other Topics Concern  .  None   Social History Narrative  . None    Her Allergies Are:  Allergies  Allergen Reactions  . Sulfur   :   Her Current Medications Are:  Outpatient Encounter Prescriptions as of 09/15/2013  Medication Sig  . atorvastatin (LIPITOR) 20 MG tablet Take 1 tablet by mouth daily.  . Cholecalciferol (VITAMIN D) 400 UNITS capsule Take 1 capsule by mouth daily.  Marland Kitchen dexlansoprazole (DEXILANT) 60 MG capsule Take 60 mg by mouth daily.  Marland Kitchen levothyroxine (SYNTHROID, LEVOTHROID) 150 MCG tablet Take 1 tablet by mouth daily.  . metFORMIN (GLUCOPHAGE) 500 MG tablet Take 500 mg by  mouth daily.  Marland Kitchen nystatin cream (MYCOSTATIN) Apply 1 application topically as needed.  Marland Kitchen omeprazole (PRILOSEC) 40 MG capsule Take 1 capsule by mouth daily.  : Review of Systems:  Out of a complete 14 point review of systems, all are reviewed and negative with the exception of these symptoms as listed below:  Review of Systems  Constitutional: Positive for fatigue.  HENT: Positive for tinnitus and trouble swallowing.   Eyes:       Blurred vision  Respiratory: Positive for apnea (snoring).   Cardiovascular: Negative.   Gastrointestinal: Positive for diarrhea and constipation.  Endocrine: Positive for heat intolerance.  Genitourinary: Positive for frequency and hematuria.  Musculoskeletal: Positive for arthralgias and joint swelling.  Skin: Negative.   Allergic/Immunologic: Negative.   Neurological: Positive for headaches.  Hematological: Bruises/bleeds easily.  Psychiatric/Behavioral: Positive for sleep disturbance (snoring, e.d.s.).    Objective:  Neurologic Exam  Physical Exam Physical Examination:   Filed Vitals:   09/15/13 0824  BP: 126/71  Pulse: 72  Temp: 97.7 F (36.5 C)    General Examination: The patient is a very pleasant 63 y.o. female in no acute distress. She appears well-developed and well-nourished and very well groomed.   HEENT: Normocephalic, atraumatic, pupils are equal, round and reactive to light and accommodation. Funduscopic exam is normal with sharp disc margins noted. Extraocular tracking is good without limitation to gaze excursion or nystagmus noted. Normal smooth pursuit is noted. Hearing is grossly intact. Tympanic membranes are clear bilaterally. Face is symmetric with normal facial animation and normal facial sensation. Speech is clear with no dysarthria noted. There is no hypophonia. There is no lip, neck/head, jaw or voice tremor. Neck is supple with full range of passive and active motion. There are no carotid bruits on auscultation. Oropharynx  exam reveals: mild mouth dryness, adequate dental hygiene and moderate airway crowding, due to narrow airway entry and larger tongue. Mallampati is class II. Tongue protrudes centrally and palate elevates symmetrically. Tonsils are absent. Neck size is 15.25 inches. She has a minimail overbite. Nasal inspection reveals fairly significant nasal mucosal bogginess and redness and no septal deviation.   Chest: Clear to auscultation without wheezing, rhonchi or crackles noted.  Heart: S1+S2+0, regular and normal without murmurs, rubs or gallops noted.   Abdomen: Soft, non-tender and non-distended with normal bowel sounds appreciated on auscultation.  Extremities: There is trace pitting edema in the distal lower extremities bilaterally around the ankles. Pedal pulses are intact.  Skin: Warm and dry without trophic changes noted. There are no varicose veins.  Musculoskeletal: exam reveals no obvious joint deformities, tenderness or joint swelling or erythema.   Neurologically:  Mental status: The patient is awake, alert and oriented in all 4 spheres. Her immediate and remote memory, attention, language skills and fund of knowledge are appropriate. There is no evidence of aphasia, agnosia, apraxia or anomia.  Speech is clear with normal prosody and enunciation. Thought process is linear. Mood is normal and affect is normal.  Cranial nerves II - XII are as described above under HEENT exam. In addition: shoulder shrug is normal with equal shoulder height noted. Motor exam: Normal bulk, strength and tone is noted. There is no drift, tremor or rebound. Romberg is negative. Reflexes are 1+ throughout. Babinski: Toes are flexor bilaterally. Fine motor skills and coordination: intact with normal finger taps, normal hand movements, normal rapid alternating patting, normal foot taps and normal foot agility.  Cerebellar testing: No dysmetria or intention tremor on finger to nose testing. Heel to shin is unremarkable  bilaterally. There is no truncal or gait ataxia.  Sensory exam: intact to light touch, pinprick, vibration, temperature sense in the upper and lower extremities, with slight reduction in temperature sense in the medial LLE distally.  Gait, station and balance: She stands easily. No veering to one side is noted. No leaning to one side is noted. Posture is age-appropriate and stance is narrow based. Gait shows normal stride length and normal pace. No problems turning are noted. She turns en bloc. Tandem walk is unremarkable. Intact toe and heel stance is noted.               Assessment and Plan:   In summary, Sumer Moorehouse is a very pleasant 63 y.o.-year old female with a history and physical exam concerning for obstructive sleep apnea (OSA). She has some telltale findings for a high pre-test probability for obstructive sleep apnea, including loud snoring, witnessed apneas, waking up with headaches, multiple nighttime awakenings, nocturia, daytime somnolence, obesity and a tighter looking airway. I had a long chat with the patient about my findings and the diagnosis of OSA, its prognosis and treatment options. We talked about medical treatments, surgical interventions and non-pharmacological approaches. I explained in particular the risks and ramifications of untreated moderate to severe OSA, especially with respect to developing cardiovascular disease down the Road, including congestive heart failure, difficult to treat hypertension, cardiac arrhythmias, or stroke. Even type 2 diabetes has, in part, been linked to untreated OSA. Symptoms of untreated OSA include daytime sleepiness, memory problems, mood irritability and mood disorder such as depression and anxiety, lack of energy, as well as recurrent headaches, especially morning headaches. We talked about trying to maintain a healthy lifestyle in general, as well as the importance of weight control. I encouraged the patient to eat healthy, exercise  daily and keep well hydrated, to keep a scheduled bedtime and wake time routine, to not skip any meals and eat healthy snacks in between meals. I advised the patient not to drive when feeling sleepy. Furthermore, we talked about improving her sleep hygiene such as cutting out TV at night and not sleeping with any pets in the bed.  I recommended the following at this time: sleep study with potential positive airway pressure titration.  I explained the sleep test procedure to the patient and also outlined possible surgical and non-surgical treatment options of OSA, including the use of a custom-made dental device (which would require a referral to a specialist dentist or oral surgeon), upper airway surgical options, such as pillar implants, radiofrequency surgery, tongue base surgery, and UPPP (which would involve a referral to an ENT surgeon). Rarely, jaw surgery such as mandibular advancement may be considered.  I also explained the CPAP treatment option to the patient, who indicated that she would be willing to try CPAP if the need arises. I  explained the importance of being compliant with PAP treatment, not only for insurance purposes but primarily to improve Her symptoms, and for the patient's long term health benefit, including to reduce Her cardiovascular risks. I answered all her questions today and the patient was in agreement. I would like to see her back after the sleep study is completed and encouraged her to call with any interim questions, concerns, problems or updates.   Thank you very much for allowing me to participate in the care of this nice patient. If I can be of any further assistance to you please do not hesitate to call me at [phone].  Sincerely,   Natalie Foley, MD, PhD

## 2013-10-16 ENCOUNTER — Ambulatory Visit (INDEPENDENT_AMBULATORY_CARE_PROVIDER_SITE_OTHER): Payer: BC Managed Care – PPO | Admitting: Neurology

## 2013-10-16 ENCOUNTER — Encounter: Payer: Self-pay | Admitting: Neurology

## 2013-10-16 DIAGNOSIS — E669 Obesity, unspecified: Secondary | ICD-10-CM

## 2013-10-16 DIAGNOSIS — M545 Low back pain, unspecified: Secondary | ICD-10-CM

## 2013-10-16 DIAGNOSIS — R51 Headache: Secondary | ICD-10-CM

## 2013-10-16 DIAGNOSIS — G4733 Obstructive sleep apnea (adult) (pediatric): Secondary | ICD-10-CM

## 2013-10-16 DIAGNOSIS — G4710 Hypersomnia, unspecified: Secondary | ICD-10-CM

## 2013-10-16 DIAGNOSIS — R519 Headache, unspecified: Secondary | ICD-10-CM

## 2013-10-16 DIAGNOSIS — R351 Nocturia: Secondary | ICD-10-CM

## 2013-11-03 ENCOUNTER — Telehealth: Payer: Self-pay | Admitting: Neurology

## 2013-11-03 DIAGNOSIS — G4733 Obstructive sleep apnea (adult) (pediatric): Secondary | ICD-10-CM

## 2013-11-03 NOTE — Telephone Encounter (Signed)
Please call and notify patient that the recent sleep study confirmed the diagnosis of OSA. She did very well with CPAP during the study with significant improvement of the respiratory events. Therefore, I would like start the patient on CPAP at home. I placed the order in the chart.   Arrange for CPAP set up at home through a DME company of patient's choice and fax/route report to PCP and referring MD (if other than PCP).   The patient will also need a follow up appointment with me in 6-8 weeks post set up that has to be scheduled; help the patient schedule this (in a follow-up slot).   Please re-enforce the importance of compliance with treatment and the need for us to monitor compliance data.   Once you have spoken to the patient and scheduled the return appointment, you may close this encounter, thanks,   Breeona Waid, MD, PhD [facility]    

## 2013-11-09 ENCOUNTER — Encounter: Payer: Self-pay | Admitting: Neurology

## 2013-11-09 NOTE — Telephone Encounter (Signed)
Called the patient to discuss sleep study results.  Pt understands the diagnosis of severe obstructive sleep apnea and that CPAP therapy has been recommended.  She is aware [facility]  A copy of the patient's sleep study was sent to the referring provider, Dr. Virl Sonammy Boyd and the patient will receive her sleep study report via mail.

## 2014-01-12 ENCOUNTER — Ambulatory Visit
Admission: RE | Admit: 2014-01-12 | Discharge: 2014-01-12 | Disposition: A | Discharge status: Home / Self Care | Payer: BC Managed Care – PPO | Source: Ambulatory Visit

## 2014-01-12 DIAGNOSIS — Z1231 Encounter for screening mammogram for malignant neoplasm of breast: Secondary | ICD-10-CM

## 2014-01-19 ENCOUNTER — Encounter: Payer: Self-pay | Admitting: Neurology

## 2014-01-27 NOTE — Progress Notes (Signed)
Quick Note:  I reviewed the patient's CPAP compliance data from 12/19/2013 to 01/17/2014, which is a total of 30 days, during which time the patient used CPAP on 22 days. The average usage for all days was 5 hours and 13 minutes. The percent used days greater than 4 hours was 70%, indicating adequate but borderline compliance. The residual AHI was 3.3 per hour, indicating an appropriate treatment pressure of 9 cwp with EPR of 3. Air leak from the mask was low. I will review this data with the patient at the next office visit, which is currently not scheduled. We will get in touch with the patient to schedule her followup appointment.  Huston FoleySaima Josslynn Mentzer, MD, PhD [facility]   ______

## 2014-02-11 ENCOUNTER — Telehealth: Payer: Self-pay | Admitting: *Deleted

## 2014-02-11 ENCOUNTER — Encounter: Payer: Self-pay | Admitting: *Deleted

## 2014-02-11 NOTE — Telephone Encounter (Signed)
I have tried to reach the pt at home, but VM is not set up. Tried mobile and VM was full. I am sending a letter to notify the pt of apt date and time change due to Dr. Teofilo PodAthar's schedule changes.

## 2014-02-17 ENCOUNTER — Ambulatory Visit: Payer: BC Managed Care – PPO | Admitting: Neurology

## 2014-02-21 ENCOUNTER — Encounter: Payer: Self-pay | Admitting: Neurology

## 2014-02-22 ENCOUNTER — Ambulatory Visit (INDEPENDENT_AMBULATORY_CARE_PROVIDER_SITE_OTHER): Payer: BC Managed Care – PPO | Admitting: Neurology

## 2014-02-22 ENCOUNTER — Encounter: Payer: Self-pay | Admitting: Neurology

## 2014-02-22 VITALS — BP 121/62 | HR 84 | Temp 97.8°F | Ht 65.5 in | Wt 221.0 lb

## 2014-02-22 DIAGNOSIS — G4733 Obstructive sleep apnea (adult) (pediatric): Secondary | ICD-10-CM

## 2014-02-22 DIAGNOSIS — E669 Obesity, unspecified: Secondary | ICD-10-CM

## 2014-02-22 DIAGNOSIS — Z9989 Dependence on other enabling machines and devices: Principal | ICD-10-CM

## 2014-02-22 NOTE — Patient Instructions (Signed)

## 2014-02-22 NOTE — Progress Notes (Signed)
Subjective:    Patient ID: Natalie Vincent is a 63 y.o. female.  HPI     Interim history:   Natalie Vincent is a very pleasant 63 year old right-handed woman with an underlying medical history of allergic rhinitis, type 2 diabetes, reflux disease, hyperlipidemia, vitamin D deficiency, hypothyroidism, s/p thyroidectomy for thyroid cancer, HE and TE, and obesity, who presents for follow-up consultation of her sleep apnea, after her recent sleep study. The patient is unaccompanied today. I first met her on 09/15/2013 at the request of her primary care physician, at which time the patient reported snoring, witnessed apneas as well as multiple nighttime awakenings, nocturia and daytime somnolence with recurrent morning headaches. I invited her back for sleep study. She had a split-night sleep study on 10/16/2013 and went over her test results with her in detail today. Baseline sleep efficiency was reduced at 60.7% with a latency to sleep of 29 minutes and wake after sleep onset of 57.5 minutes with moderate sleep fragmentation noted. She had an elevated arousal index. She had an increased percentage of stage I sleep, an increased percentage of REM sleep and a normal REM latency. She reported stiffness in her lower back and right hip pain. She went to the bathroom 4 times. She complained of stomach cramps. She had moderate to loud snoring. She slept mostly on her sides. Total AHI was 38.7 per hour. Average oxygen saturation was 94% with a nadir of 82%. She was started on CPAP therapy. Sleep efficiency was slightly better at 69.3%. Her arousal index was improved. She had an increased percentage of slow-wave sleep and an increased percentage of REM sleep. No significant PLMS were noted. CPAP was titrated from 5-9 cm with a residual AHI of 0 per hour at the final pressure. REM sleep was achieved but no supine REM sleep.  I reviewed the patient's CPAP compliance data from 12/19/2013 to 01/17/2014,  which is a total of 30 days, during which time the patient used CPAP on 22 days. The average usage for all days was  5 hours and  13 minutes. The percent used days greater than 4 hours was  70%, indicating adequate but borderline compliance. The residual AHI was  3.3 per hour, indicating an appropriate treatment pressure of  9 cwp with EPR of 3. Air leak from the mask was low.  Today, I reviewed her compliance data from 01/21/2014 to 02/19/2014, which is a total of 30 days during which time she used her machine 26 days, percent used days greater than 4 hours was 87%, indicating very good compliance, average usage of 6 hours and 6 minutes, residual AHI at 2.9, leak low. Pressure at 9 cm with EPR of 3.  Today, she reports having had better sleep with less daytime sleepiness, maybe a little improved with her nocturia and her AM HAs. She has been compliant with CPAP.   She reports nocturia x 3-4/night, daytime somnolence and recurrent morning headaches. Her ESS is 14/24 today. She has been snoring for years, but has been this sleepy for the last year and her weight has been fluctuating. She has some mild RLS symptoms, but is not aware of any leg kicking. She wakes up with a HA at least 2 times per week. Her snoring can be mild to loud and she has woken herself up with a sense of gasping.  Her typical bedtime is reported to be around 11 PM and usual wake time is around 6:30 AM. Sleep onset typically occurs within minutes. She  reports feeling marginally rested upon awakening. She wakes up on an average 4 times in the middle of the night. There is report of nighttime reflux, with occasional nighttime cough experienced. There is no family history of RLS or OSA.   She is a restless sleeper and in the morning, the bed is quite disheveled. She has LBP and sciatica to the L and cannot sleep on the back, but also not consistently on one side.   She denies cataplexy, sleep paralysis, hypnagogic or hypnopompic  hallucinations, or sleep attacks. She does not report any vivid dreams, nightmares, dream enactments, or parasomnias, such as sleep talking or sleep walking. The patient has not had a sleep study or a home sleep test.   She consumes 2 caffeinated beverages per day, usually in the form of coffee.   Her bedroom is usually dark and cool. There is a TV in the bedroom and usually it is on at night. Occasionally, her daughter's dog is in the bed with them.    Her Past Medical History Is Significant For: Past Medical History  Diagnosis Date  . Diabetes   . Migraine   . Hypercholesteremia     Her Past Surgical History Is Significant For: Past Surgical History  Procedure Laterality Date  . Cervical disc surgery      titanium rod  . Abdominal hysterectomy      Her Family History Is Significant For: Family History  Problem Relation Age of Onset  . Cancer Brother   . Cancer Sister   . Cancer Father   . Cancer Paternal Uncle   . Diabetes Mother   . Kidney failure Mother     Her Social History Is Significant For: History   Social History  . Marital Status: Married    Spouse Name: Sam    Number of Children: 2  . Years of Education: 18   Occupational History  .      [employer]   Social History Main Topics  . Smoking status: Never Smoker   . Smokeless tobacco: Never Used  . Alcohol Use: No  . Drug Use: No  . Sexual Activity: Not on file   Other Topics Concern  . Not on file   Social History Narrative   Patient consumes 4 -5 cups of caffeine daily.    Her Allergies Are:  Allergies  Allergen Reactions  . Sulfur     Reaction unknown  :   Her Current Medications Are:  Outpatient Encounter Prescriptions as of 02/22/2014  Medication Sig  . atorvastatin (LIPITOR) 20 MG tablet Take 1 tablet by mouth daily.  . cholecalciferol (VITAMIN D) 1000 UNITS tablet Take 1,000 Units by mouth daily.  Marland Kitchen dexlansoprazole (DEXILANT) 60 MG capsule Take 60 mg by mouth daily.  Marland Kitchen  dexlansoprazole (DEXILANT) 60 MG capsule Take 60 mg by mouth.  . levothyroxine (SYNTHROID, LEVOTHROID) 150 MCG tablet Take 1 tablet by mouth daily.  . metFORMIN (GLUCOPHAGE) 500 MG tablet Take 500 mg by mouth 2 (two) times daily.   Marland Kitchen nystatin cream (MYCOSTATIN) Apply 1 application topically as needed.  . [DISCONTINUED] Cholecalciferol (VITAMIN D) 400 UNITS capsule Take 1 capsule by mouth daily.  . [DISCONTINUED] omeprazole (PRILOSEC) 40 MG capsule Take 1 capsule by mouth daily.  :  Review of Systems:  Out of a complete 14 point review of systems, all are reviewed and negative with the exception of these symptoms as listed below:   Review of Systems  HENT: Positive for trouble swallowing.  Running nose, ringing in ears  Gastrointestinal: Positive for abdominal pain.  Genitourinary:       Frequent urination  Musculoskeletal: Positive for neck pain and neck stiffness.  Skin:       moles  Neurological: Positive for headaches.       Frequent waking    Objective:  Neurologic Exam  Physical Exam Physical Examination:   Filed Vitals:   02/22/14 0828  BP: 121/62  Pulse: 84  Temp: 97.8 F (36.6 C)   General Examination: The patient is a very pleasant 63 y.o. female in no acute distress. She appears well-developed and well-nourished and very well groomed.   HEENT: Normocephalic, atraumatic, pupils are equal, round and reactive to light and accommodation. Funduscopic exam is normal with sharp disc margins noted. Extraocular tracking is good without limitation to gaze excursion or nystagmus noted. Normal smooth pursuit is noted. Hearing is grossly intact. Face is symmetric with normal facial animation and normal facial sensation. Speech is clear with no dysarthria noted. There is no hypophonia. There is no lip, neck/head, jaw or voice tremor. Neck is supple with full range of passive and active motion. There are no carotid bruits on auscultation. Oropharynx exam reveals: mild mouth  dryness, adequate dental hygiene and moderate airway crowding, due to narrow airway entry and larger tongue. Mallampati is class II. Tongue protrudes centrally and palate elevates symmetrically. Tonsils are absent. She has a minimail overbite. Nasal inspection reveals fairly significant nasal mucosal bogginess and redness and no septal deviation.   Chest: Clear to auscultation without wheezing, rhonchi or crackles noted.  Heart: S1+S2+0, regular and normal without murmurs, rubs or gallops noted.   Abdomen: Soft, non-tender and non-distended with normal bowel sounds appreciated on auscultation.  Extremities: There is trace pitting edema in the distal lower extremities bilaterally around the ankles. Pedal pulses are intact.  Skin: Warm and dry without trophic changes noted. There are no varicose veins.  Musculoskeletal: exam reveals no obvious joint deformities, tenderness or joint swelling or erythema.   Neurologically:  Mental status: The patient is awake, alert and oriented in all 4 spheres. Her immediate and remote memory, attention, language skills and fund of knowledge are appropriate. There is no evidence of aphasia, agnosia, apraxia or anomia. Speech is clear with normal prosody and enunciation. Thought process is linear. Mood is normal and affect is normal.  Cranial nerves II - XII are as described above under HEENT exam. In addition: shoulder shrug is normal with equal shoulder height noted. Motor exam: Normal bulk, strength and tone is noted. There is no drift, tremor or rebound. Romberg is negative. Reflexes are 1+ throughout. Babinski: Toes are flexor bilaterally. Fine motor skills and coordination: intact with normal finger taps, normal hand movements, normal rapid alternating patting, normal foot taps and normal foot agility.  Cerebellar testing: No dysmetria or intention tremor on finger to nose testing. Heel to shin is slightly difficult on the left. She reports some pulling in her  back. There is no truncal or gait ataxia.  Sensory exam: intact to light touch, pinprick, vibration, temperature sense in the upper and lower extremities, with slight reduction in temperature sense in the medial LLE distally.  Gait, station and balance: She stands easily. No veering to one side is noted. No leaning to one side is noted. Posture is age-appropriate and stance is narrow based. Gait shows normal stride length and normal pace. No problems turning are noted. She turns en bloc. Tandem walk is slightly difficult for her  but she can do it without assistance or corrective steps.              Assessment and Plan:   In summary, Larkin Alfred is a very pleasant 63 year old female with an underlying medical history of allergic rhinitis, type 2 diabetes, reflux disease, hyperlipidemia, vitamin D deficiency, hypothyroidism, s/p thyroidectomy for thyroid cancer, HE and TE, and obesity, who presents for follow-up consultation of her sleep apnea, after her recent sleep study. Her split-night sleep study results from July 2015 were explained to her with detail. She has severe obstructive sleep apnea with an AHI about 35 per hour. She has since then been placed on CPAP treatment. She has done well with it. She indicates improved daytime somnolence, improved sleep consolidation and possibly mildly improved nocturia and morning headaches. She is compliant with treatment and is congratulated on her compliance. We went over her compliance data as well in detail and I explained the numbers to her. Her physical exam is stable. She is trying to lose weight. I again explained in particular the risks and ramifications of untreated moderate to severe OSA, especially with respect to developing cardiovascular disease down the Road, including congestive heart failure, difficult to treat hypertension, cardiac arrhythmias, or stroke. Even type 2 diabetes has, in part, been linked to untreated OSA. Symptoms of  untreated OSA include daytime sleepiness, memory problems, mood irritability and mood disorder such as depression and anxiety, lack of energy, as well as recurrent headaches, especially morning headaches. We talked about trying to maintain a healthy lifestyle in general, as well as the importance of weight control. I explained the importance of being compliant with PAP treatment, not only for insurance purposes but primarily to improve Her symptoms, and for the patient's long term health benefit, including to reduce Her cardiovascular risks. Since she is doing well from my end of things I have asked her to return in 6 months for routine checkup of her sleep apnea. I answered all her questions today and the patient was in agreement. I enouraged her to call with any interim questions, concerns, problems or updates.

## 2014-03-21 ENCOUNTER — Encounter: Payer: Self-pay | Admitting: Neurology

## 2014-06-07 ENCOUNTER — Encounter (HOSPITAL_COMMUNITY): Payer: Self-pay | Admitting: *Deleted

## 2014-06-07 ENCOUNTER — Observation Stay (HOSPITAL_COMMUNITY)
Admission: EM | Admit: 2014-06-07 | Discharge: 2014-06-09 | Disposition: A | Discharge status: Home / Self Care | Payer: BLUE CROSS/BLUE SHIELD | Attending: Family Medicine | Admitting: Family Medicine

## 2014-06-07 ENCOUNTER — Emergency Department (HOSPITAL_COMMUNITY): Payer: BLUE CROSS/BLUE SHIELD

## 2014-06-07 DIAGNOSIS — R55 Syncope and collapse: Secondary | ICD-10-CM | POA: Diagnosis not present

## 2014-06-07 DIAGNOSIS — D649 Anemia, unspecified: Secondary | ICD-10-CM | POA: Diagnosis not present

## 2014-06-07 DIAGNOSIS — G43909 Migraine, unspecified, not intractable, without status migrainosus: Secondary | ICD-10-CM | POA: Diagnosis present

## 2014-06-07 DIAGNOSIS — E039 Hypothyroidism, unspecified: Secondary | ICD-10-CM | POA: Diagnosis not present

## 2014-06-07 DIAGNOSIS — E119 Type 2 diabetes mellitus without complications: Secondary | ICD-10-CM

## 2014-06-07 DIAGNOSIS — E780 Pure hypercholesterolemia: Secondary | ICD-10-CM | POA: Insufficient documentation

## 2014-06-07 DIAGNOSIS — Z79899 Other long term (current) drug therapy: Secondary | ICD-10-CM | POA: Diagnosis not present

## 2014-06-07 LAB — CBC WITH DIFFERENTIAL/PLATELET
Basophils Absolute: 0 10*3/uL (ref 0.0–0.1)
Basophils Relative: 0 % (ref 0–1)
Eosinophils Absolute: 0.1 10*3/uL (ref 0.0–0.7)
Eosinophils Relative: 1 % (ref 0–5)
HCT: 34.4 % — ABNORMAL LOW (ref 36.0–46.0)
Hemoglobin: 11 g/dL — ABNORMAL LOW (ref 12.0–15.0)
Lymphocytes Relative: 21 % (ref 12–46)
Lymphs Abs: 1.9 10*3/uL (ref 0.7–4.0)
MCH: 26.1 pg (ref 26.0–34.0)
MCHC: 32 g/dL (ref 30.0–36.0)
MCV: 81.7 fL (ref 78.0–100.0)
Monocytes Absolute: 0.5 10*3/uL (ref 0.1–1.0)
Monocytes Relative: 6 % (ref 3–12)
Neutro Abs: 6.5 10*3/uL (ref 1.7–7.7)
Neutrophils Relative %: 72 % (ref 43–77)
Platelets: 282 10*3/uL (ref 150–400)
RBC: 4.21 MIL/uL (ref 3.87–5.11)
RDW: 15.2 % (ref 11.5–15.5)
WBC: 9 10*3/uL (ref 4.0–10.5)

## 2014-06-07 LAB — I-STAT TROPONIN, ED: Troponin i, poc: 0 ng/mL (ref 0.00–0.08)

## 2014-06-07 LAB — COMPREHENSIVE METABOLIC PANEL
ALT: 17 U/L (ref 0–35)
AST: 21 U/L (ref 0–37)
Albumin: 3.9 g/dL (ref 3.5–5.2)
Alkaline Phosphatase: 48 U/L (ref 39–117)
Anion gap: 7 (ref 5–15)
BUN: 15 mg/dL (ref 6–23)
CO2: 26 mmol/L (ref 19–32)
Calcium: 9 mg/dL (ref 8.4–10.5)
Chloride: 103 mmol/L (ref 96–112)
Creatinine, Ser: 0.81 mg/dL (ref 0.50–1.10)
GFR calc Af Amer: 88 mL/min — ABNORMAL LOW (ref >90)
GFR calc non Af Amer: 76 mL/min — ABNORMAL LOW (ref >90)
Glucose, Bld: 148 mg/dL — ABNORMAL HIGH (ref 70–99)
Potassium: 3.8 mmol/L (ref 3.5–5.1)
Sodium: 136 mmol/L (ref 135–145)
Total Bilirubin: 0.5 mg/dL (ref 0.3–1.2)
Total Protein: 6.7 g/dL (ref 6.0–8.3)

## 2014-06-07 LAB — URINALYSIS, ROUTINE W REFLEX MICROSCOPIC
Bilirubin Urine: NEGATIVE
Glucose, UA: NEGATIVE mg/dL
Ketones, ur: NEGATIVE mg/dL
Leukocytes, UA: NEGATIVE
Nitrite: NEGATIVE
Protein, ur: NEGATIVE mg/dL
Specific Gravity, Urine: 1.016 (ref 1.005–1.030)
Urobilinogen, UA: 0.2 mg/dL (ref 0.0–1.0)
pH: 6 (ref 5.0–8.0)

## 2014-06-07 LAB — URINE MICROSCOPIC-ADD ON

## 2014-06-07 LAB — CBG MONITORING, ED: Glucose-Capillary: 141 mg/dL — ABNORMAL HIGH (ref 70–99)

## 2014-06-07 MED ORDER — GI COCKTAIL ~~LOC~~
30.0000 mL | Freq: Once | ORAL | Status: AC
  Administered 2014-06-07: 30 mL via ORAL
  Filled 2014-06-07: qty 30

## 2014-06-07 MED ORDER — SODIUM CHLORIDE 0.9 % IV BOLUS (SEPSIS)
1000.0000 mL | Freq: Once | INTRAVENOUS | Status: AC
  Administered 2014-06-07: 1000 mL via INTRAVENOUS

## 2014-06-07 MED ORDER — ONDANSETRON HCL 4 MG/2ML IJ SOLN
4.0000 mg | Freq: Once | INTRAMUSCULAR | Status: AC
  Administered 2014-06-07: 4 mg via INTRAVENOUS
  Filled 2014-06-07: qty 2

## 2014-06-07 NOTE — ED Notes (Signed)
Pt husband states the pt lost consciousness while sitting on the couch. Her husband states she said she was nauseas, then appeared to lose consciousness for ~30sec. Pt's husband states she is diabetic, did not check CBG.

## 2014-06-07 NOTE — ED Notes (Signed)
Pt ambulated to restroom w/o difficulty, denies dizziness.

## 2014-06-07 NOTE — ED Provider Notes (Signed)
CSN: [id number]     Arrival date & time 06/07/14  2006 History   First MD Initiated Contact with Patient 06/07/14 2100     Chief Complaint  Patient presents with  . Loss of Consciousness  . Nausea     (Consider location/radiation/quality/duration/timing/severity/associated sxs/prior Treatment) Patient is a 64 y.o. female presenting with syncope.  Loss of Consciousness Episode history:  Single Most recent episode:  Today Duration:  30 seconds Timing:  Constant Progression:  Unchanged Chronicity:  New Context: sitting down   Context: not standing up   Witnessed: yes   Relieved by:  Nothing Worsened by:  Nothing tried Ineffective treatments:  None tried Associated symptoms: chest pain (epigastric) and nausea   Associated symptoms: no anxiety, no confusion, no fever, no focal weakness, no shortness of breath and no vomiting     Past Medical History  Diagnosis Date  . Diabetes   . Migraine   . Hypercholesteremia    Past Surgical History  Procedure Laterality Date  . Cervical disc surgery      titanium rod  . Abdominal hysterectomy     Family History  Problem Relation Age of Onset  . Cancer Brother   . Cancer Sister   . Cancer Father   . Cancer Paternal Uncle   . Diabetes Mother   . Kidney failure Mother    History  Substance Use Topics  . Smoking status: Never Smoker   . Smokeless tobacco: Never Used  . Alcohol Use: No   OB History    No data available     Review of Systems  Constitutional: Negative for fever.  Respiratory: Negative for shortness of breath.   Cardiovascular: Positive for chest pain (epigastric) and syncope.  Gastrointestinal: Positive for nausea. Negative for vomiting.  Neurological: Negative for focal weakness.  Psychiatric/Behavioral: Negative for confusion.  All other systems reviewed and are negative.     Allergies  Sulfur  Home Medications   Prior to Admission medications   Medication Sig Start Date End Date Taking?  Authorizing Provider  atorvastatin (LIPITOR) 20 MG tablet Take 1 tablet by mouth daily. 04/14/13  Yes Historical Provider, MD  dexlansoprazole (DEXILANT) 60 MG capsule Take 60 mg by mouth daily. 04/14/13  Yes Historical Provider, MD  levothyroxine (SYNTHROID, LEVOTHROID) 150 MCG tablet Take 1 tablet by mouth daily. 08/11/13  Yes Historical Provider, MD  METFORMIN HCL ER, OSM, PO Take 500 mg by mouth 2 (two) times daily.   Yes Historical Provider, MD   BP 149/68 mmHg  Pulse 73  Temp(Src) 98.1 F (36.7 C) (Oral)  Resp 18  Ht  (1.702 m)  Wt 217 lb 6 oz (98.6 kg)  BMI 34.04 kg/m2  SpO2 97% Physical Exam  Constitutional: She is oriented to person, place, and time. She appears well-developed and well-nourished.  HENT:  Head: Normocephalic and atraumatic.  Right Ear: External ear normal.  Left Ear: External ear normal.  Eyes: Conjunctivae and EOM are normal. Pupils are equal, round, and reactive to light.  Neck: Normal range of motion. Neck supple.  Cardiovascular: Normal rate, regular rhythm, normal heart sounds and intact distal pulses.   Pulmonary/Chest: Effort normal and breath sounds normal.  Abdominal: Soft. Bowel sounds are normal. There is no tenderness.  Musculoskeletal: Normal range of motion.  Neurological: She is alert and oriented to person, place, and time. She has normal strength and normal reflexes. No cranial nerve deficit or sensory deficit. GCS eye subscore is 4. GCS verbal subscore is 5.  GCS motor subscore is 6.  Skin: Skin is warm and dry.  Vitals reviewed.   ED Course  Procedures (including critical care time) Labs Review Labs Reviewed  COMPREHENSIVE METABOLIC PANEL - Abnormal; Notable for the following:    Glucose, Bld 148 (*)    GFR calc non Af Amer 76 (*)    GFR calc Af Amer 88 (*)    All other components within normal limits  CBC WITH DIFFERENTIAL/PLATELET - Abnormal; Notable for the following:    Hemoglobin 11.0 (*)    HCT 34.4 (*)    All other  components within normal limits  URINALYSIS, ROUTINE W REFLEX MICROSCOPIC - Abnormal; Notable for the following:    Hgb urine dipstick TRACE (*)    All other components within normal limits  CBC - Abnormal; Notable for the following:    Hemoglobin 11.0 (*)    HCT 34.4 (*)    All other components within normal limits  BASIC METABOLIC PANEL - Abnormal; Notable for the following:    Glucose, Bld 183 (*)    GFR calc non Af Amer 71 (*)    GFR calc Af Amer 82 (*)    Anion gap 4 (*)    All other components within normal limits  GLUCOSE, CAPILLARY - Abnormal; Notable for the following:    Glucose-Capillary 115 (*)    All other components within normal limits  CBG MONITORING, ED - Abnormal; Notable for the following:    Glucose-Capillary 141 (*)    All other components within normal limits  URINE MICROSCOPIC-ADD ON  TROPONIN I  TROPONIN I  TROPONIN I  I-STAT TROPOININ, ED    Imaging Review Dg Chest 2 View  06/07/2014   CLINICAL DATA:  Weakness and syncopal episode.  EXAM: CHEST  2 VIEW  COMPARISON:  None currently available  FINDINGS: Normal heart size and mediastinal contours. No acute infiltrate or edema. No effusion or pneumothorax. Multilevel cervical discectomy, which may also account for clips at the thoracic inlet.  IMPRESSION: No active cardiopulmonary disease.   Electronically Signed   By: Marnee SpringJonathon  Watts M.D.   On: 06/07/2014 22:48     EKG Interpretation   Date/Time:  Tuesday June 07 2014 22:31:15 EST Ventricular Rate:  70 PR Interval:  153 QRS Duration: 81 QT Interval:  415 QTC Calculation: 448 R Axis:   81 Text Interpretation:  Sinus rhythm Borderline right axis deviation  Borderline T wave abnormalities No significant change since last tracing  Confirmed by Mirian MoGentry, Matthew [phone]([id number]) on 06/07/2014 11:35:15 PM      MDM   Final diagnoses:  Syncope, unspecified syncope type    64 y.o. female with pertinent PMH of DM, HTN presents with diarrhea for the last 2 days and  syncope today.  Essentially no antecedent symptoms.  She had return to baseline without frank fall.  Physical exam on arrival benign.  Wu unremarkable.  Admitted for observation given concerning history of potential drop attack.    I have reviewed all laboratory and imaging studies if ordered as above  1. Syncope, unspecified syncope type         Mirian MoMatthew Gentry, MD 06/08/14 1139

## 2014-06-08 DIAGNOSIS — G43909 Migraine, unspecified, not intractable, without status migrainosus: Secondary | ICD-10-CM | POA: Diagnosis present

## 2014-06-08 DIAGNOSIS — R55 Syncope and collapse: Secondary | ICD-10-CM

## 2014-06-08 DIAGNOSIS — E119 Type 2 diabetes mellitus without complications: Secondary | ICD-10-CM

## 2014-06-08 DIAGNOSIS — E08329 Diabetes mellitus due to underlying condition with mild nonproliferative diabetic retinopathy without macular edema: Secondary | ICD-10-CM

## 2014-06-08 LAB — CBC
HCT: 34.4 % — ABNORMAL LOW (ref 36.0–46.0)
Hemoglobin: 11 g/dL — ABNORMAL LOW (ref 12.0–15.0)
MCH: 26.1 pg (ref 26.0–34.0)
MCHC: 32 g/dL (ref 30.0–36.0)
MCV: 81.7 fL (ref 78.0–100.0)
Platelets: 279 10*3/uL (ref 150–400)
RBC: 4.21 MIL/uL (ref 3.87–5.11)
RDW: 15.3 % (ref 11.5–15.5)
WBC: 8.8 10*3/uL (ref 4.0–10.5)

## 2014-06-08 LAB — BASIC METABOLIC PANEL
Anion gap: 4 — ABNORMAL LOW (ref 5–15)
BUN: 13 mg/dL (ref 6–23)
CO2: 27 mmol/L (ref 19–32)
Calcium: 8.8 mg/dL (ref 8.4–10.5)
Chloride: 107 mmol/L (ref 96–112)
Creatinine, Ser: 0.86 mg/dL (ref 0.50–1.10)
GFR calc Af Amer: 82 mL/min — ABNORMAL LOW (ref >90)
GFR calc non Af Amer: 71 mL/min — ABNORMAL LOW (ref >90)
Glucose, Bld: 183 mg/dL — ABNORMAL HIGH (ref 70–99)
Potassium: 3.8 mmol/L (ref 3.5–5.1)
Sodium: 138 mmol/L (ref 135–145)

## 2014-06-08 LAB — GLUCOSE, CAPILLARY
Glucose-Capillary: 115 mg/dL — ABNORMAL HIGH (ref 70–99)
Glucose-Capillary: 133 mg/dL — ABNORMAL HIGH (ref 70–99)
Glucose-Capillary: 118 mg/dL — ABNORMAL HIGH (ref 70–99)
Glucose-Capillary: 122 mg/dL — ABNORMAL HIGH (ref 70–99)

## 2014-06-08 LAB — TROPONIN I
Troponin I: <0.03 ng/mL (ref <0.031)
Troponin I: <0.03 ng/mL (ref <0.031)
Troponin I: <0.03 ng/mL (ref <0.031)

## 2014-06-08 MED ORDER — PANTOPRAZOLE SODIUM 40 MG PO TBEC
40.0000 mg | DELAYED_RELEASE_TABLET | Freq: Every day | ORAL | Status: DC
  Administered 2014-06-08: 40 mg via ORAL
  Filled 2014-06-08 (×2): qty 1

## 2014-06-08 MED ORDER — ATORVASTATIN CALCIUM 10 MG PO TABS
20.0000 mg | ORAL_TABLET | Freq: Every day | ORAL | Status: DC
  Filled 2014-06-08: qty 2

## 2014-06-08 MED ORDER — INSULIN ASPART 100 UNIT/ML ~~LOC~~ SOLN: 0.0000 [IU] | Freq: Three times a day (TID) | SUBCUTANEOUS | Status: DC

## 2014-06-08 MED ORDER — LEVOTHYROXINE SODIUM 25 MCG PO TABS
150.0000 ug | ORAL_TABLET | Freq: Every day | ORAL | Status: DC
  Administered 2014-06-08 – 2014-06-09 (×2): 150 ug via ORAL
  Filled 2014-06-08: qty 1
  Filled 2014-06-08: qty 2
  Filled 2014-06-08: qty 1
  Filled 2014-06-08: qty 2

## 2014-06-08 MED ORDER — ONDANSETRON HCL 4 MG/2ML IJ SOLN: 4.0000 mg | Freq: Four times a day (QID) | INTRAMUSCULAR | Status: DC | PRN

## 2014-06-08 MED ORDER — ACETAMINOPHEN 325 MG PO TABS
650.0000 mg | ORAL_TABLET | Freq: Four times a day (QID) | ORAL | Status: DC | PRN
  Administered 2014-06-08 – 2014-06-09 (×3): 650 mg via ORAL
  Filled 2014-06-08 (×3): qty 2

## 2014-06-08 MED ORDER — ENOXAPARIN SODIUM 40 MG/0.4ML ~~LOC~~ SOLN
40.0000 mg | SUBCUTANEOUS | Status: DC
  Administered 2014-06-08: 40 mg via SUBCUTANEOUS
  Filled 2014-06-08 (×3): qty 0.4

## 2014-06-08 MED ORDER — ACETAMINOPHEN 650 MG RE SUPP: 650.0000 mg | Freq: Four times a day (QID) | RECTAL | Status: DC | PRN

## 2014-06-08 NOTE — H&P (Addendum)
[facility] History and Physical  Cela Newcom ZOX:[id number] DOB: 1951/02/07 DOA: 06/07/2014  Referring physician: EDP PCP: Verlon Au, MD   Chief Complaint: passed out  HPI: Natalie Vincent is a 64 y.o. female  With past medical history of diabetes, history of migraines  Presents to the ER with the above complaint.   she has had intermittent diarrhea for about a week with some abdominal cramping and gas,  Today she was sitting on her couch and then suddenly started feeling nauseous and subsequently lost consciousness,  This lasted for approximately 30 seconds.  She denies any chest pain present but has noticed some occasional epigastric discomfort along with some gas.  In ER vital signs, labs unremarkable and EKG nonspecific   Review of Systems: positives bolded Constitutional:  No weight loss, night sweats, Fevers, chills, fatigue.  HEENT:  No headaches, Difficulty swallowing,Tooth/dental problems,Sore throat,  No sneezing, itching, ear ache, nasal congestion, post nasal drip,  Cardio-vascular:  No chest pain, Orthopnea, PND, swelling in lower extremities, anasarca, dizziness, palpitations  GI:  No heartburn, indigestion, abdominal pain, nausea, vomiting, diarrhea, change in bowel habits, loss of appetite  Resp:  No shortness of breath with exertion or at rest. No excess mucus, no productive cough, No non-productive cough, No coughing up of blood.No change in color of mucus.No wheezing.No chest wall deformity  Skin:  no rash or lesions.  GU:  no dysuria, change in color of urine, no urgency or frequency. No flank pain.  Musculoskeletal:  No joint pain or swelling. No decreased range of motion. No back pain.  Psych:  No change in mood or affect. No depression or anxiety. No memory loss.   Past Medical History  Diagnosis Date  . Diabetes   . Migraine   . Hypercholesteremia    Past Surgical History  Procedure Laterality Date  . Cervical  disc surgery      titanium rod  . Abdominal hysterectomy     Social History:  reports that she has never smoked. She has never used smokeless tobacco. She reports that she does not drink alcohol or use illicit drugs.  Allergies  Allergen Reactions  . Sulfur     Reaction unknown    Family History  Problem Relation Age of Onset  . Cancer Brother   . Cancer Sister   . Cancer Father   . Cancer Paternal Uncle   . Diabetes Mother   . Kidney failure Mother     Prior to Admission medications   Medication Sig Start Date End Date Taking? Authorizing Provider  atorvastatin (LIPITOR) 20 MG tablet Take 1 tablet by mouth daily. 04/14/13  Yes Historical Provider, MD  dexlansoprazole (DEXILANT) 60 MG capsule Take 60 mg by mouth daily. 04/14/13  Yes Historical Provider, MD  levothyroxine (SYNTHROID, LEVOTHROID) 150 MCG tablet Take 1 tablet by mouth daily. 08/11/13  Yes Historical Provider, MD  METFORMIN HCL ER, OSM, PO Take 500 mg by mouth 2 (two) times daily.   Yes Historical Provider, MD   Physical Exam: Filed Vitals:   06/07/14 2233 06/07/14 2234 06/07/14 2235 06/07/14 2300  BP: 151/66 154/69 158/74 136/60  Pulse: 67 68 73 72  Temp:      TempSrc:      Resp: SpO2: 96% 100% 98% 100%    Wt Readings from Last 3 Encounters:  02/22/14 100.245 kg (221 lb)  09/15/13 98.884 kg (218 lb)    General:  Appears calm and comfortable Eyes: PERRL, normal  lids, irises & conjunctiva ENT: grossly normal hearing, lips & tongue Neck: no LAD, masses or thyromegaly Cardiovascular: RRR, no m/r/g. No LE edema. Telemetry: SR, no arrhythmias  Respiratory: CTA bilaterally, no w/r/r. Normal respiratory effort. Abdomen: soft, ntnd Skin: no rash or induration seen on limited exam Musculoskeletal: grossly normal tone BUE/BLE Psychiatric: grossly normal mood and affect, speech fluent and appropriate Neurologic: grossly non-focal.          Labs on Admission:  Basic Metabolic Panel:  Recent  Labs Lab 06/07/14 2145  NA 136  K 3.8  CL 103  CO2 26  GLUCOSE 148*  BUN 15  CREATININE 0.81  CALCIUM 9.0   Liver Function Tests:  Recent Labs Lab 06/07/14 2145  AST 21  ALT 17  ALKPHOS 48  BILITOT 0.5  PROT 6.7  ALBUMIN 3.9   No results for input(s): LIPASE, AMYLASE in the last 168 hours. No results for input(s): AMMONIA in the last 168 hours. CBC:  Recent Labs Lab 06/07/14 2145  WBC 9.0  NEUTROABS 6.5  HGB 11.0*  HCT 34.4*  MCV 81.7  PLT 282   Cardiac Enzymes: No results for input(s): CKTOTAL, CKMB, CKMBINDEX, TROPONINI in the last 168 hours.  BNP (last 3 results) No results for input(s): BNP in the last 8760 hours.  ProBNP (last 3 results) No results for input(s): PROBNP in the last 8760 hours.  CBG:  Recent Labs Lab 06/07/14 2017  GLUCAP 141*    Radiological Exams on Admission: Dg Chest 2 View  06/07/2014   CLINICAL DATA:  Weakness and syncopal episode.  EXAM: CHEST  2 VIEW  COMPARISON:  None currently available  FINDINGS: Normal heart size and mediastinal contours. No acute infiltrate or edema. No effusion or pneumothorax. Multilevel cervical discectomy, which may also account for clips at the thoracic inlet.  IMPRESSION: No active cardiopulmonary disease.   Electronically Signed   By: Marnee SpringJonathon  Watts M.D.   On: 06/07/2014 22:48    EKG: Independently reviewed. NSR, slight flattening of T waves in lateral leads  Assessment/Plan    Syncope -suspect Vasovagal etiology vs arrhythmia   check Orthostatic vital signs -monitor on tele, cycle cardiac enzymes and check 2D ECHO    Diabetes mellitus -hold metformin, SSI for now    Hypothyroidism  -continue synthroid   Mild Anemia: -Recent normal colonoscopy, bili WNL, defer further workup to PCP  Code Status: Full Code DVT Prophylaxis: lovenox Family Communication: husband at bedside Disposition Plan: home pending workup  Time spent: 60min  Mill[facility] HospitalJOSEPH,Rexanne Inocencio [facility] Pager  [phone]

## 2014-06-08 NOTE — Progress Notes (Signed)
Echocardiogram 2D Echocardiogram has been performed.  Natalie Vincent, Natalie Vincent M 06/08/2014, 11:27 AM

## 2014-06-08 NOTE — Progress Notes (Signed)
Patient seen and evaluated earlier this a.m. by my associate. Please refer to her H&P regarding assessment and plan.   Currently suspicion is vasovagal etiology. Will follow-up with work up including 2-D echo cardiac enzymes and telemetry monitoring. Telemetry was reviewed prior to evaluating patient on my own. No red flags reported some PVCs.  The patient has no new complaints.  Physical exam Gen: Patient in no acute distress alert and awake Cardiovascular: S1 and S2 present within normal limits Pulmonary: No increased work of breathing clear to auscultation bilaterally  GregoryVEGA, Energy East CorporationLANDO

## 2014-06-09 DIAGNOSIS — E119 Type 2 diabetes mellitus without complications: Secondary | ICD-10-CM

## 2014-06-09 LAB — GLUCOSE, CAPILLARY
Glucose-Capillary: 102 mg/dL — ABNORMAL HIGH (ref 70–99)
Glucose-Capillary: 101 mg/dL — ABNORMAL HIGH (ref 70–99)

## 2014-06-09 NOTE — Discharge Summary (Signed)
Physician Discharge Summary  Natalie Vincent ZOX:[id number] DOB: 27-Oct-1950 DOA: 06/07/2014  PCP: Verlon Au, MD  Admit date: 06/07/2014 Discharge date: 06/09/2014  Time spent: > 35 minutes  Recommendations for Outpatient Follow-up:  1. Please be sure to follow-up with your primary care physician. Monitor blood pressures 2. Patient had grade 1 diastolic dysfunction on echocardiogram please provide further evaluation recommendations  Discharge Diagnoses:  Active Problems:   Syncope   Diabetes mellitus   Migraine   Discharge Condition: Stable  Diet recommendation: Carb modified diet  Filed Weights   06/08/14 0120  Weight: 98.6 kg (217 lb 6 oz)    History of present illness:  Patient is a 64 year old African-American female with history of diabetes who presented to the hospital after syncopal episode.  Hospital Course:  Syncopal episode - Workup negative. Telemetry only positive for PVCs but otherwise no other red flags on evaluation. Echocardiogram showed normal EF with grade 1 diastolic dysfunction. - Orthostatic vital signs on last check negative - Patient denies any dizziness and is requesting to go home - Suspect this was most likely vasovagal episode which occurred while patient was eating I suspect that she had low intravascular volume this time. This resolved after patient received 2 L of IV fluids initially and improved oral intake throughout hospital stay. - Troponins negative 3 - Patient reported no complaints and me regarding shortness of breath, tachycardia, hemoptysis and as such d-dimer was not obtained  Diabetes mellitus - We'll continue metformin on discharge which is patient's home medication regimen and recommend continue diabetic diet. - She is to monitor her blood sugars at least 2 times daily  Procedures:  None  Consultations:  None  Discharge Exam: Filed Vitals:   06/09/14 1440  BP: 142/67  Pulse: 68  Temp: 98.3 F (36.8 C)   Resp: 18    General: Pt in nad, alert and awake Cardiovascular: rrr, no mrg Respiratory: cta bl, no wheezes, no increased wob  Discharge Instructions   Discharge Instructions    Call MD for:  severe uncontrolled pain    Complete by:  As directed      Call MD for:  temperature >100.4    Complete by:  As directed      Diet - low sodium heart healthy    Complete by:  As directed      Discharge instructions    Complete by:  As directed   Please consider obtaining a blood pressure cuff and monitoring your blood pressures daily. Follow up with your primary care physician in 1-2 weeks or sooner should any new concerns arise.     Increase activity slowly    Complete by:  As directed           Current Discharge Medication List    CONTINUE these medications which have NOT CHANGED   Details  atorvastatin (LIPITOR) 20 MG tablet Take 1 tablet by mouth daily.    dexlansoprazole (DEXILANT) 60 MG capsule Take 60 mg by mouth daily.    levothyroxine (SYNTHROID, LEVOTHROID) 150 MCG tablet Take 1 tablet by mouth daily.    METFORMIN HCL ER, OSM, PO Take 500 mg by mouth 2 (two) times daily.       Allergies  Allergen Reactions  . Sulfur     Reaction unknown      The results of significant diagnostics from this hospitalization (including imaging, microbiology, ancillary and laboratory) are listed below for reference.    Significant Diagnostic Studies: Dg Chest 2 View  06/07/2014   CLINICAL DATA:  Weakness and syncopal episode.  EXAM: CHEST  2 VIEW  COMPARISON:  None currently available  FINDINGS: Normal heart size and mediastinal contours. No acute infiltrate or edema. No effusion or pneumothorax. Multilevel cervical discectomy, which may also account for clips at the thoracic inlet.  IMPRESSION: No active cardiopulmonary disease.   Electronically Signed   By: Marnee SpringJonathon  Watts M.D.   On: 06/07/2014 22:48    Microbiology: No results found for this or any previous visit (from the past  240 hour(s)).   Labs: Basic Metabolic Panel:  Recent Labs Lab 06/07/14 2145 06/08/14 0152  NA 136 138  K 3.8 3.8  CL 103 107  CO2 26 27  GLUCOSE 148* 183*  BUN 15 13  CREATININE 0.81 0.86  CALCIUM 9.0 8.8   Liver Function Tests:  Recent Labs Lab 06/07/14 2145  AST 21  ALT 17  ALKPHOS 48  BILITOT 0.5  PROT 6.7  ALBUMIN 3.9   No results for input(s): LIPASE, AMYLASE in the last 168 hours. No results for input(s): AMMONIA in the last 168 hours. CBC:  Recent Labs Lab 06/07/14 2145 06/08/14 0152  WBC 9.0 8.8  NEUTROABS 6.5  --   HGB 11.0* 11.0*  HCT 34.4* 34.4*  MCV 81.7 81.7  PLT 282 279   Cardiac Enzymes:  Recent Labs Lab 06/08/14 0152 06/08/14 0840 06/08/14 1428  TROPONINI <0.03 <0.03 <0.03   BNP: BNP (last 3 results) No results for input(s): BNP in the last 8760 hours.  ProBNP (last 3 results) No results for input(s): PROBNP in the last 8760 hours.  CBG:  Recent Labs Lab 06/08/14 1202 06/08/14 1727 06/08/14 2042 06/09/14 0730 06/09/14 1146  GLUCAP 133* 118* 122* 102* 101*       Signed:  Penny PiaVEGA, Ancelmo Hunt  [facility] 06/09/2014, 3:43 PM

## 2014-06-09 NOTE — Progress Notes (Signed)
I completed D/C education with pt and she is being D/C in stable condition with family.

## 2014-07-25 ENCOUNTER — Ambulatory Visit (INDEPENDENT_AMBULATORY_CARE_PROVIDER_SITE_OTHER): Payer: BLUE CROSS/BLUE SHIELD | Admitting: Neurology

## 2014-07-25 ENCOUNTER — Encounter: Payer: Self-pay | Admitting: Neurology

## 2014-07-25 VITALS — BP 118/72 | HR 72 | Resp 16 | Ht 66.0 in | Wt 218.0 lb

## 2014-07-25 DIAGNOSIS — M25511 Pain in right shoulder: Secondary | ICD-10-CM | POA: Diagnosis not present

## 2014-07-25 DIAGNOSIS — M542 Cervicalgia: Secondary | ICD-10-CM | POA: Diagnosis not present

## 2014-07-25 DIAGNOSIS — R55 Syncope and collapse: Secondary | ICD-10-CM | POA: Diagnosis not present

## 2014-07-25 DIAGNOSIS — G4733 Obstructive sleep apnea (adult) (pediatric): Secondary | ICD-10-CM | POA: Diagnosis not present

## 2014-07-25 DIAGNOSIS — Z9989 Dependence on other enabling machines and devices: Secondary | ICD-10-CM

## 2014-07-25 DIAGNOSIS — M5442 Lumbago with sciatica, left side: Secondary | ICD-10-CM

## 2014-07-25 NOTE — Patient Instructions (Signed)
I am going to refer you to ortho for your right shoulder.    Please monitor your left neck pain and ear pain. It is getting better, but should it worsen, you may need to see ENT.   Please continue using your CPAP regularly. While your insurance requires that you use CPAP at least 4 hours each night on 70% of the nights, I recommend, that you not skip any nights and use it throughout the night if you can. Getting used to CPAP and staying with the treatment long term does take time and patience and discipline. Untreated obstructive sleep apnea when it is moderate to severe can have an adverse impact on cardiovascular health and raise her risk for heart disease, arrhythmias, hypertension, congestive heart failure, stroke and diabetes. Untreated obstructive sleep apnea causes sleep disruption, nonrestorative sleep, and sleep deprivation. This can have an impact on your day to day functioning and cause daytime sleepiness and impairment of cognitive function, memory loss, mood disturbance, and problems focussing. Using CPAP regularly can improve these symptoms.  I can see you back in a year.   Your exam is stable.   Please remember, common syncope triggers are: Sleep deprivation, dehydration, overheating, stress, hypoglycemia or skipping meals, certain medications or excessive alcohol use. Avoid these triggers as much as you can. Change positions slowly.

## 2014-07-25 NOTE — Progress Notes (Signed)
Subjective:    Patient ID: Dimitria Ketchum is a 64 y.o. female.  HPI     Interim history:   Ms. Egle is a 64 year old right-handed woman with an underlying medical history of allergic rhinitis, type 2 diabetes, reflux disease, hyperlipidemia, vitamin D deficiency, hypothyroidism, s/p thyroidectomy for thyroid cancer, HE and TE, and obesity, who presents for presents for a new problem after a hospitalization. She is referred by her PCP for syncope. I last saw for follow-up of her obstructive sleep apnea on 02/22/2014, at which time the patient reported sleeping better with CPAP. Her CPAP compliance with adequate. She reported improved nocturia morning headaches.   Today, 07/25/2014: She presented to the emergency room after syncopal episode on 06/09/2014. She is admitted to the hospital. I reviewed the hospital records. She had a echocardiogram which showed grade 1 diastolic dysfunction. She reported a prodromal illness with diarrhea and abdominal cramping. She syncopal episode lasted for about 30 seconds. She was on her couch and suddenly felt nauseated and lost consciousness. In the emergency room's, vital signs and EKG as well as labs were unremarkable. She was admitted to the hospital for observation, including telemetry and echocardiogram. She had some PVCs on her telemetry. Cardiac enzymes were negative. She reports no sequelae from her syncopal spell. She denies any lightheadedness upon standing. She has no vertigo. She has been having left neck pain especially with turning. She feels that there was a swollen muscle at one time but symptoms have improved overall in the last few days. She has had more issues with her right shoulder. Years ago she had seen orthopedics for this. She feels that her pain in range of motion or getting worse and worse. She has ongoing low back pain with occasional radiation to the left. She had upper neck surgery years ago.   Previously:  I first  met her on 09/15/2013 at the request of her primary care physician, at which time the patient reported snoring, witnessed apneas as well as multiple nighttime awakenings, nocturia and daytime somnolence with recurrent morning headaches. I invited her back for sleep study. She had a split-night sleep study on 10/16/2013 and went over her test results with her in detail today. Baseline sleep efficiency was reduced at 60.7% with a latency to sleep of 29 minutes and wake after sleep onset of 57.5 minutes with moderate sleep fragmentation noted. She had an elevated arousal index. She had an increased percentage of stage I sleep, an increased percentage of REM sleep and a normal REM latency. She reported stiffness in her lower back and right hip pain. She went to the bathroom 4 times. She complained of stomach cramps. She had moderate to loud snoring. She slept mostly on her sides. Total AHI was 38.7 per hour. Average oxygen saturation was 94% with a nadir of 82%. She was started on CPAP therapy. Sleep efficiency was slightly better at 69.3%. Her arousal index was improved. She had an increased percentage of slow-wave sleep and an increased percentage of REM sleep. No significant PLMS were noted. CPAP was titrated from 5-9 cm with a residual AHI of 0 per hour at the final pressure. REM sleep was achieved but no supine REM sleep.  I reviewed the patient's CPAP compliance data from 12/19/2013 to 01/17/2014, which is a total of 30 days, during which time the patient used CPAP on 22 days. The average usage for all days was  5 hours and  13 minutes. The percent used days greater than 4  hours was  70%, indicating adequate but borderline compliance. The residual AHI was  3.3 per hour, indicating an appropriate treatment pressure of  9 cwp with EPR of 3. Air leak from the mask was low.  I reviewed her compliance data from 01/21/2014 to 02/19/2014, which is a total of 30 days during which time she used her machine 26 days,  percent used days greater than 4 hours was 87%, indicating very good compliance, average usage of 6 hours and 6 minutes, residual AHI at 2.9, leak low. Pressure at 9 cm with EPR of 3.   She reports nocturia x 3-4/night, daytime somnolence and recurrent morning headaches. Her ESS is 14/24 today. She has been snoring for years, but has been this sleepy for the last year and her weight has been fluctuating. She has some mild RLS symptoms, but is not aware of any leg kicking. She wakes up with a HA at least 2 times per week. Her snoring can be mild to loud and she has woken herself up with a sense of gasping.   Her typical bedtime is reported to be around 11 PM and usual wake time is around 6:30 AM. Sleep onset typically occurs within minutes. She reports feeling marginally rested upon awakening. She wakes up on an average 4 times in the middle of the night. There is report of nighttime reflux, with occasional nighttime cough experienced. There is no family history of RLS or OSA.   She is a restless sleeper and in the morning, the bed is quite disheveled. She has LBP and sciatica to the L and cannot sleep on the back, but also not consistently on one side.   She denies cataplexy, sleep paralysis, hypnagogic or hypnopompic hallucinations, or sleep attacks. She does not report any vivid dreams, nightmares, dream enactments, or parasomnias, such as sleep talking or sleep walking. The patient has not had a sleep study or a home sleep test.   She consumes 2 caffeinated beverages per day, usually in the form of coffee.   Her bedroom is usually dark and cool. There is a TV in the bedroom and usually it is on at night. Occasionally, her daughter's dog is in the bed with them.    Her Past Medical History Is Significant For: Past Medical History  Diagnosis Date  . Diabetes   . Migraine   . Hypercholesteremia     Her Past Surgical History Is Significant For: Past Surgical History  Procedure Laterality Date  .  Cervical disc surgery      titanium rod  . Abdominal hysterectomy      Her Family History Is Significant For: Family History  Problem Relation Age of Onset  . Cancer Brother   . Cancer Sister   . Cancer Father   . Cancer Paternal Uncle   . Diabetes Mother   . Kidney failure Mother     Her Social History Is Significant For: History   Social History  . Marital Status: Married    Spouse Name: Sam  . Number of Children: 2  . Years of Education: 18   Occupational History  .      [employer]   Social History Main Topics  . Smoking status: Never Smoker   . Smokeless tobacco: Never Used  . Alcohol Use: No  . Drug Use: No  . Sexual Activity: Not on file   Other Topics Concern  . None   Social History Narrative   Patient consumes 4 -5 cups of  caffeine daily.    Her Allergies Are:  Allergies  Allergen Reactions  . Sulfur     Reaction unknown  :   Her Current Medications Are:  Outpatient Encounter Prescriptions as of 07/25/2014  Medication Sig  . atorvastatin (LIPITOR) 20 MG tablet Take 1 tablet by mouth daily.  Marland Kitchen dexlansoprazole (DEXILANT) 60 MG capsule Take 60 mg by mouth daily.  Marland Kitchen levothyroxine (SYNTHROID, LEVOTHROID) 150 MCG tablet Take 1 tablet by mouth daily.  Marland Kitchen METFORMIN HCL ER, OSM, PO Take 500 mg by mouth 2 (two) times daily.  :  Review of Systems:  Out of a complete 14 point review of systems, all are reviewed and negative with the exception of these symptoms as listed below:   Review of Systems  HENT: Positive for ear pain and trouble swallowing.        Facial swelling   Respiratory: Positive for choking.   Gastrointestinal: Positive for nausea.  Genitourinary:       Frequency of urination   Musculoskeletal:       Joint pain, Back pain, muscle cramps, Neck pain, Neck stiffness  Neurological: Positive for syncope and headaches.       Syncopal episode about a month ago, reports full unconsciousness, witnessed by others, treated at hospital  afterwards.   Hematological:       Swollen lymph nodes     Objective:  Neurologic Exam  Physical Exam Physical Examination:   Filed Vitals:   07/25/14 0830  BP: 118/72  Pulse: 72  Resp: 16    General Examination: The patient is a very pleasant 64 y.o. female in no acute distress. She appears well-developed and well-nourished and very well groomed.   HEENT: Normocephalic, atraumatic, pupils are equal, round and reactive to light and accommodation. Funduscopic exam is normal with sharp disc margins noted. Extraocular tracking is good without limitation to gaze excursion or nystagmus noted. Normal smooth pursuit is noted.  tympanic membranes are clear bilaterally. Hearing is grossly intact. Face is symmetric with normal facial animation and normal facial sensation. Speech is clear with no dysarthria noted. There is no hypophonia. There is no lip, neck/head, jaw or voice tremor. Neck is supple with  mild decrease in range of motion and some pulling reported on her left side. I did not palpate any swelling or lymphadenopathy. There are no carotid bruits on auscultation. Oropharynx exam reveals:  moderate mouth dryness, adequate dental hygiene and moderate airway crowding, due to narrow airway entry and larger tongue. Mallampati is class II. Tongue protrudes centrally and palate elevates symmetrically. Tonsils are absent. She has a minimail overbite. Nasal inspection reveals fairly significant nasal mucosal bogginess and redness and no septal deviation.   Chest: Clear to auscultation without wheezing, rhonchi or crackles noted.  Heart: S1+S2+0, regular and normal without murmurs, rubs or gallops noted.   Abdomen: Soft, non-tender and non-distended with normal bowel sounds appreciated on auscultation.  Extremities: There is no  pitting edema in the distal lower extremities bilaterally around the ankles. Pedal pulses are intact.  Skin: Warm and dry without trophic changes noted. There are no  varicose veins.  Musculoskeletal: exam reveals no obvious joint deformities, tenderness or joint swelling or erythema with the exception of right shoulder pain and decrease in range of motion in her right shoulder. ,    Neurologically:  Mental status: The patient is awake, alert and oriented in all 4 spheres. Her immediate and remote memory, attention, language skills and fund of knowledge are appropriate. There  is no evidence of aphasia, agnosia, apraxia or anomia. Speech is clear with normal prosody and enunciation. Thought process is linear. Mood is normal and affect is normal.  Cranial nerves II - XII are as described above under HEENT exam. In addition: shoulder shrug is normal with equal shoulder height noted. Motor exam: Normal bulk, strength and tone is noted. There is no drift, tremor or rebound. Romberg is negative. Reflexes are 1+ throughout. Babinski: Toes are flexor bilaterally. Fine motor skills and coordination: intact with normal finger taps, normal hand movements, normal rapid alternating patting, normal foot taps and normal foot agility.  Cerebellar testing: No dysmetria or intention tremor on finger to nose testing. Heel to shin is slightly difficult on the left. She reports some pulling in her back. There is no truncal or gait ataxia.  Sensory exam: intact to light touch, pinprick, vibration, temperature sense in the upper and lower extremities, with slight reduction in temperature sense in the medial LLE distally, unchanged from before.  Gait, station and balance: She stands easily. No veering to one side is noted. No leaning to one side is noted. Posture is age-appropriate and stance is narrow based. Gait shows normal stride length and normal pace. No problems turning are noted. She turns en bloc. Tandem walk is slightly difficult for her but she can do it without assistance or corrective steps.              Assessment and Plan:   In summary, Amabel Stmarie is a very  pleasant 63 year old female with an underlying medical history of allergic rhinitis, type 2 diabetes, reflux disease, hyperlipidemia, vitamin D deficiency, hypothyroidism, s/p thyroidectomy for thyroid cancer, HE and TE, and obesity, who presents after a syncopal spell in March 2016. She was hospitalized from 06/07/14 to 06/09/14 and I reviewed her hospital records and test results. As far as further issues such as pre-syncope, lightheadedness, dizziness or vertiginous symptoms. Sleep apnea-wise she is doing well. She reports some additional issues including left-sided neck pain radiating into her ear. She also has you pain she feels. All of this has slightly improved in the last few days. She also reports worsening right shoulder pain and decrease in range of motion. She has had this for years and had seen orthopedics years ago and was told she has bone spurs. I suggested we pursue orthopedics referral. On examination, neurologically she is stable and nonfocal. She has right shoulder pain and decrease in range of motion. She has unchanged decrease in temperature sense in the medial aspect of her right leg. Her neck exam is benign. She does report some neck muscle pulling when turning her head from side to side but reports that this has actually improved. She is compliant with CPAP treatment for sleep apnea and is encouraged to continue with treatment. She is congratulated on her treatment adherence. Treatment settings are fine including leak information looks good and her residual AHI is 2.5 per hour. She has noted improvement in her daytime somnolence, improved sleep consolidation and improved nocturia and morning headaches. She is advised to drink plenty of fluid and change positions slowly. Given a nonfocal neurological exam, and no residual problems after her syncopal spell, she is advised to follow-up with me as needed and yearly for sleep apnea checkup. I explained the importance of being compliant with PAP  treatment, not only for insurance purposes but primarily to improve Her symptoms, and for the patient's long term health benefit, including to reduce Her cardiovascular risks.  I made a referral to orthopedics today. In addition, should her neck pain and ear pain get worse she is advised to consider seeing ENT.  I answered all her questions today and the patient was in agreement. I encouraged her to call with any interim questions, concerns, problems or updates.  I spent 25 min in total face-to-face time with the patient, more than 50% of which was spent in counseling and coordination of care, reviewing test results, reviewing medication and discussing or reviewing the diagnosis of syncope, obstructive sleep apnea, shoulder arthritis, and their treatment options.

## 2014-08-23 ENCOUNTER — Ambulatory Visit: Payer: BC Managed Care – PPO | Admitting: Neurology

## 2014-09-08 ENCOUNTER — Other Ambulatory Visit: Payer: Self-pay | Admitting: Gastroenterology

## 2014-09-08 DIAGNOSIS — R1310 Dysphagia, unspecified: Secondary | ICD-10-CM

## 2014-09-09 ENCOUNTER — Ambulatory Visit
Admission: RE | Admit: 2014-09-09 | Discharge: 2014-09-09 | Disposition: A | Discharge status: Home / Self Care | Payer: BLUE CROSS/BLUE SHIELD | Source: Ambulatory Visit | Attending: Gastroenterology | Admitting: Gastroenterology

## 2014-09-09 DIAGNOSIS — R1310 Dysphagia, unspecified: Secondary | ICD-10-CM

## 2015-07-25 ENCOUNTER — Ambulatory Visit: Payer: Self-pay | Admitting: Neurology

## 2016-09-24 ENCOUNTER — Other Ambulatory Visit: Payer: Self-pay | Admitting: Family Medicine

## 2016-09-24 DIAGNOSIS — E2839 Other primary ovarian failure: Secondary | ICD-10-CM

## 2016-09-24 DIAGNOSIS — Z1231 Encounter for screening mammogram for malignant neoplasm of breast: Secondary | ICD-10-CM

## 2016-10-14 ENCOUNTER — Other Ambulatory Visit: Payer: BLUE CROSS/BLUE SHIELD

## 2016-11-07 ENCOUNTER — Other Ambulatory Visit: Payer: Self-pay | Admitting: Family Medicine

## 2016-11-07 DIAGNOSIS — Z1231 Encounter for screening mammogram for malignant neoplasm of breast: Secondary | ICD-10-CM

## 2016-11-07 DIAGNOSIS — E2839 Other primary ovarian failure: Secondary | ICD-10-CM

## 2016-11-12 ENCOUNTER — Other Ambulatory Visit: Payer: BLUE CROSS/BLUE SHIELD

## 2016-11-13 ENCOUNTER — Ambulatory Visit: Payer: BLUE CROSS/BLUE SHIELD

## 2016-11-22 ENCOUNTER — Ambulatory Visit: Payer: BLUE CROSS/BLUE SHIELD

## 2016-11-22 ENCOUNTER — Other Ambulatory Visit: Payer: BLUE CROSS/BLUE SHIELD

## 2016-11-26 ENCOUNTER — Ambulatory Visit
Admission: RE | Admit: 2016-11-26 | Discharge: 2016-11-26 | Disposition: A | Discharge status: Home / Self Care | Payer: Medicare Other | Source: Ambulatory Visit | Attending: Family Medicine | Admitting: Family Medicine

## 2016-11-26 DIAGNOSIS — Z1231 Encounter for screening mammogram for malignant neoplasm of breast: Secondary | ICD-10-CM

## 2016-11-26 DIAGNOSIS — E2839 Other primary ovarian failure: Secondary | ICD-10-CM

## 2018-01-02 ENCOUNTER — Other Ambulatory Visit: Payer: Self-pay | Admitting: Family Medicine

## 2018-01-02 DIAGNOSIS — Z1231 Encounter for screening mammogram for malignant neoplasm of breast: Secondary | ICD-10-CM

## 2018-01-09 ENCOUNTER — Ambulatory Visit
Admission: RE | Admit: 2018-01-09 | Discharge: 2018-01-09 | Disposition: A | Discharge status: Home / Self Care | Payer: PRIVATE HEALTH INSURANCE | Source: Ambulatory Visit | Attending: Family Medicine | Admitting: Family Medicine

## 2018-01-09 DIAGNOSIS — Z1231 Encounter for screening mammogram for malignant neoplasm of breast: Secondary | ICD-10-CM

## 2019-02-03 ENCOUNTER — Other Ambulatory Visit: Payer: Self-pay

## 2019-02-03 DIAGNOSIS — Z20822 Contact with and (suspected) exposure to covid-19: Secondary | ICD-10-CM

## 2019-02-05 LAB — NOVEL CORONAVIRUS, NAA: SARS-CoV-2, NAA: NOT DETECTED

## 2019-02-11 ENCOUNTER — Other Ambulatory Visit: Payer: Self-pay | Admitting: Family Medicine

## 2019-02-11 DIAGNOSIS — Z1231 Encounter for screening mammogram for malignant neoplasm of breast: Secondary | ICD-10-CM

## 2019-04-15 ENCOUNTER — Ambulatory Visit: Payer: PRIVATE HEALTH INSURANCE

## 2020-03-28 ENCOUNTER — Other Ambulatory Visit: Payer: PRIVATE HEALTH INSURANCE

## 2020-03-28 DIAGNOSIS — Z20822 Contact with and (suspected) exposure to covid-19: Secondary | ICD-10-CM

## 2020-03-30 LAB — NOVEL CORONAVIRUS, NAA: SARS-CoV-2, NAA: NOT DETECTED

## 2020-03-30 LAB — SARS-COV-2, NAA 2 DAY TAT

## 2020-05-08 ENCOUNTER — Other Ambulatory Visit: Payer: Self-pay | Admitting: Family Medicine

## 2020-05-08 DIAGNOSIS — Z1231 Encounter for screening mammogram for malignant neoplasm of breast: Secondary | ICD-10-CM

## 2020-06-21 ENCOUNTER — Ambulatory Visit
Admission: RE | Admit: 2020-06-21 | Discharge: 2020-06-21 | Disposition: A | Discharge status: Home / Self Care | Payer: Medicare PPO | Source: Ambulatory Visit | Attending: Family Medicine | Admitting: Family Medicine

## 2020-06-21 ENCOUNTER — Other Ambulatory Visit: Payer: Self-pay

## 2020-06-21 DIAGNOSIS — Z1231 Encounter for screening mammogram for malignant neoplasm of breast: Secondary | ICD-10-CM

## 2020-10-25 DIAGNOSIS — U071 COVID-19: Secondary | ICD-10-CM

## 2020-10-25 HISTORY — DX: COVID-19: U07.1

## 2020-11-20 ENCOUNTER — Ambulatory Visit: Payer: Medicare PPO | Admitting: Neurology

## 2020-11-20 ENCOUNTER — Encounter: Payer: Self-pay | Admitting: Neurology

## 2020-11-20 ENCOUNTER — Other Ambulatory Visit: Payer: Self-pay

## 2020-11-20 VITALS — BP 133/69 | HR 73 | Ht 65.75 in | Wt 204.8 lb

## 2020-11-20 DIAGNOSIS — G4733 Obstructive sleep apnea (adult) (pediatric): Secondary | ICD-10-CM | POA: Diagnosis not present

## 2020-11-20 DIAGNOSIS — G4719 Other hypersomnia: Secondary | ICD-10-CM

## 2020-11-20 DIAGNOSIS — R351 Nocturia: Secondary | ICD-10-CM

## 2020-11-20 DIAGNOSIS — R634 Abnormal weight loss: Secondary | ICD-10-CM

## 2020-11-20 DIAGNOSIS — R519 Headache, unspecified: Secondary | ICD-10-CM

## 2020-11-20 NOTE — Progress Notes (Signed)
Subjective:    Patient ID: Natalie Vincent is a 70 y.o. female.  HPI    Star Age, MD, PhD [facility] [address] [place], [place] [address] Dear Dr. Luciana Axe,  I saw your patient [name], upon your kind request in my sleep clinic today for reevaluation of her sleep apnea.  The patient is unaccompanied today.  As you know, [name] is a 70 year old right-handed woman with an underlying medical history of diabetes, hyperlipidemia, hypothyroidism, thyroid cancer with status post thyroidectomy, reflux disease, allergic rhinitis, vitamin D deficiency, and obesity, who reports that she has not been using her CPAP machine for the past nearly 2-1/2 years.  She stopped using her machine before the pandemic.  She reports that she received new supplies but there was a moldy smell to the supplies so she was scared to use it.  She has had some weight fluctuation, currently about 14 pounds less than what she weighed in 2015 when she had a sleep study with [place].  Her split-night sleep study from 10/16/2013 indicated severe sleep apnea with a baseline AHI of 38.7/h, O2 nadir 82%.  She was placed on CPAP of 9 cm as she did well with it at the time.  Her DME company is [employer].  She reports not sleeping well.  She has daytime somnolence and wakes up multiple times to go to the bathroom.  She tries to be in bed around 11 and rise time is currently around 8.  She has nocturia about 4-5 times per average night and has woken up with a headache.  She does not feel rested.  She has had recent stress due to her husband's fall and hip fracture.  He had a hip replacement.   She is a non-smoker and drinks caffeine in the form of coffee, about 2 cups in the mornings.  She does not currently drink any alcohol.  They have 1 dog in the household.  Her Epworth sleepiness score is 11 out of 24, fatigue severity score is 36 out of 63.    Previously:  07/25/2014: She presented to the emergency room after syncopal episode on 06/09/2014. She is admitted to the hospital. I reviewed the hospital records. She had a echocardiogram which showed grade 1 diastolic dysfunction. She reported a prodromal illness with diarrhea and abdominal cramping. She syncopal episode lasted for about 30 seconds. She was on her couch and suddenly felt nauseated and lost consciousness. In the emergency room's, vital signs and EKG as well as labs were unremarkable. She was admitted to the hospital for observation, including telemetry and echocardiogram. She had some PVCs on her telemetry. Cardiac enzymes were negative. She reports no sequelae from her syncopal spell. She denies any lightheadedness upon standing. She has no vertigo. She has been having left neck pain especially with turning. She feels that there was a swollen muscle at one time but symptoms have improved overall in the last few days. She has had more issues with her right shoulder. Years ago she had seen orthopedics for this. She feels that her pain in range of motion or getting worse and worse. She has ongoing low back pain with occasional radiation to the left. She had upper neck surgery years ago.   I saw for follow-up of her obstructive sleep apnea on 02/22/2014, at which time the patient reported sleeping better with CPAP. Her CPAP compliance with adequate. She reported improved nocturia morning headaches.    I first  met her on 09/15/2013 at the request of her primary care physician, at which time the patient reported snoring, witnessed apneas as well as multiple nighttime awakenings, nocturia and daytime somnolence with recurrent morning headaches. I invited her back for sleep study. She had a split-night sleep study on 10/16/2013 and went over her test results with her in detail today. Baseline sleep efficiency was reduced at 60.7% with a latency to sleep of 29 minutes and wake after sleep onset of  57.5 minutes with moderate sleep fragmentation noted. She had an elevated arousal index. She had an increased percentage of stage I sleep, an increased percentage of REM sleep and a normal REM latency. She reported stiffness in her lower back and right hip pain. She went to the bathroom 4 times. She complained of stomach cramps. She had moderate to loud snoring. She slept mostly on her sides. Total AHI was 38.7 per hour. Average oxygen saturation was 94% with a nadir of 82%. She was started on CPAP therapy. Sleep efficiency was slightly better at 69.3%. Her arousal index was improved. She had an increased percentage of slow-wave sleep and an increased percentage of REM sleep. No significant PLMS were noted. CPAP was titrated from 5-9 cm with a residual AHI of 0 per hour at the final pressure. REM sleep was achieved but no supine REM sleep.   I reviewed the patient's CPAP compliance data from 12/19/2013 to 01/17/2014, which is a total of 30 days, during which time the patient used CPAP on 22 days. The average usage for all days was  5 hours and  13 minutes. The percent used days greater than 4 hours was  70%, indicating adequate but borderline compliance. The residual AHI was  3.3 per hour, indicating an appropriate treatment pressure of  9 cwp with EPR of 3. Air leak from the mask was low.   I reviewed her compliance data from 01/21/2014 to 02/19/2014, which is a total of 30 days during which time she used her machine 26 days, percent used days greater than 4 hours was 87%, indicating very good compliance, average usage of 6 hours and 6 minutes, residual AHI at 2.9, leak low. Pressure at 9 cm with EPR of 3.     She reports nocturia x 3-4/night, daytime somnolence and recurrent morning headaches. Her ESS is 14/24 today. She has been snoring for years, but has been this sleepy for the last year and her weight has been fluctuating. She has some mild RLS symptoms, but is not aware of any leg kicking. She wakes up  with a HA at least 2 times per week. Her snoring can be mild to loud and she has woken herself up with a sense of gasping.   Her typical bedtime is reported to be around 11 PM and usual wake time is around 6:30 AM. Sleep onset typically occurs within minutes. She reports feeling marginally rested upon awakening. She wakes up on an average 4 times in the middle of the night. There is report of nighttime reflux, with occasional nighttime cough experienced. There is no family history of RLS or OSA.   She is a restless sleeper and in the morning, the bed is quite disheveled. She has LBP and sciatica to the L and cannot sleep on the back, but also not consistently on one side.   She denies cataplexy, sleep paralysis, hypnagogic or hypnopompic hallucinations, or sleep attacks. She does not report any vivid dreams, nightmares, dream enactments, or parasomnias, such as  sleep talking or sleep walking. The patient has not had a sleep study or a home sleep test.   She consumes 2 caffeinated beverages per day, usually in the form of coffee.   Her bedroom is usually dark and cool. There is a TV in the bedroom and usually it is on at night. Occasionally, her daughter's dog is in the bed with them.     Her Past Medical History Is Significant For: Past Medical History:  Diagnosis Date   COVID-19 10/25/2020   Diabetes ([place]ol[place])    Hypercholesteremia    Migraine     Her Past Surgical History Is Significant For: Past Surgical History:  Procedure Laterality Date   ABDOMINAL HYSTERECTOMY     CERVICAL DISC SURGERY     titanium rod    Her Family History Is Significant For: Family History  Problem Relation Age of Onset   Diabetes Mother    Kidney failure Mother    Cancer Father    Cancer Brother    Cancer Sister    Cancer Paternal Uncle    Breast cancer Neg Hx     Her Social History Is Significant For: Social History   Socioeconomic History   Marital status: Married    Spouse name: Sam   Number of  children: 2   Years of education: 18   Highest education level: Not on file  Occupational History    Comment: [employer]  Tobacco Use   Smoking status: Never   Smokeless tobacco: Never  Substance and Sexual Activity   Alcohol use: No    Alcohol/week: 0.0 standard drinks   Drug use: No   Sexual activity: Not on file  Other Topics Concern   Not on file  Social History Narrative   Patient consumes 4 -5 cups of caffeine daily.   Social Determinants of Health   Financial Resource Strain: Not on file  Food Insecurity: Not on file  Transportation Needs: Not on file  Physical Activity: Not on file  Stress: Not on file  Social Connections: Not on file    Her Allergies Are:  Allergies  Allergen Reactions   Elemental Sulfur     Reaction unknown  :   Her Current Medications Are:  Outpatient Encounter Medications as of 11/20/2020  Medication Sig   atorvastatin (LIPITOR) 20 MG tablet Take 1 tablet by mouth daily.   cholecalciferol (VITAMIN D3) 25 MCG (1000 UNIT) tablet Three times a week   Cyanocobalamin (VITAMIN B 12 PO) Take by mouth. Three times a week.   levothyroxine (SYNTHROID) 137 MCG tablet Take by mouth.   METFORMIN HCL ER, OSM, PO Take 584m in AM and 10073min PM   omeprazole (PRILOSEC) 40 MG capsule Take 40 mg by mouth daily.   valsartan (DIOVAN) 160 MG tablet Take 160 mg by mouth daily.   [DISCONTINUED] dexlansoprazole (DEXILANT) 60 MG capsule Take 60 mg by mouth daily.   [DISCONTINUED] levothyroxine (SYNTHROID, LEVOTHROID) 150 MCG tablet Take 1 tablet by mouth daily. (Patient not taking: Reported on 11/20/2020)   No facility-administered encounter medications on file as of 11/20/2020.  : Review of Systems:  Out of a complete 14 point review of systems, all are reviewed and negative with the exception of these symptoms as listed below:  Review of Systems  Neurological:        NX needs new machine.  [employer] (has not used since prior covid 2020.Epworth  Sleepiness Scale 0= would never doze 1= slight chance of dozing 2= moderate  chance of dozing 3= high chance of dozing  Sitting and reading:2 Watching TV:2 Sitting inactive in a public place (ex. Theater or meeting):2 As a passenger in a car for an hour without a break:2 Lying down to rest in the afternoon:2 Sitting and talking to someone:2 Sitting quietly after lunch (no alcohol):2 In a car, while stopped in traffic:0 Total: 11 FSS: 36     Objective:  Neurological Exam  Physical Exam Physical Examination:   Vitals:   11/20/20 1115  BP: 133/69  Pulse: 73   General Examination: The patient is a very pleasant 70 y.o. female in no acute distress. She appears well-developed and well-nourished and well groomed.   HEENT: Normocephalic, atraumatic, pupils are equal, round and reactive to light, extraocular tracking is well-preserved, face is symmetric with normal facial animation.  Speech is clear without dysarthria, hypophonia or voice tremor.  She has no carotid bruits on examination, neck is supple with full range of motion.  She reports that sometimes she has a cramp in the neck muscles and particularly points to the left SCM.  There is no current palpable cord.  Neck circumference is 15-1/4 inches.  Airway examination reveals mild to moderate mouth dryness, moderate airway crowding, due to small airway entry.  Mallampati is class II.  Tongue protrudes centrally and palate elevates symmetrically.     Chest: Clear to auscultation without wheezing, rhonchi or crackles noted.   Heart: S1+S2+0, regular and normal without murmurs, rubs or gallops noted.    Abdomen: Soft, non-tender and non-distended.   Extremities: There is no pitting edema in the distal lower extremities bilaterally around the ankles.    Skin: Warm and dry without trophic changes noted. There are no varicose veins.   Musculoskeletal: exam reveals no obvious joint deformities, but she reports low back pain and low  back stiffness.      Neurologically:  Mental status: The patient is awake, alert and oriented in all 4 spheres. Her immediate and remote memory, attention, language skills and fund of knowledge are appropriate. There is no evidence of aphasia, agnosia, apraxia or anomia. Speech is clear with normal prosody and enunciation. Thought process is linear. Mood is normal and affect is normal.  Cranial nerves II - XII are as described above under HEENT exam.  Motor exam: Normal bulk, strength and tone is noted. There is no tremor. Fine motor skills and coordination: Grossly intact.  Cerebellar testing: No dysmetria or intention tremor. There is no truncal or gait ataxia.  Sensory exam: intact to light touch in the upper and lower extremities.  Gait, station and balance: She stands with mild difficulty and reports low back discomfort and stiffness.  She walks with a very slight limp on the left initially.  No walking aid.     Assessment and Plan:   In summary, Charleene Callegari is a very pleasant 70 y.o.-year old female with an underlying medical history of diabetes, hyperlipidemia, hypothyroidism, thyroid cancer with status post thyroidectomy, reflux disease, allergic rhinitis, vitamin D deficiency, and obesity, who presents for a reevaluation of her obstructive sleep apnea, which was in the severe range by split-night testing in July 2015.  She was on CPAP therapy but has not used her machine for various different reasons since 2020 or before.  She should qualify for a new set of equipment since she got her original CPAP in 2015.  She had some weight changes and has had some weight loss compared to her test date in 2015.  We will reevaluate her sleep apnea with a sleep study.  She is agreeable to this approach.  She will be advised to schedule her study once we have her insurance authorization.  She would be willing to get back on positive airway pressure treatment as she has symptoms of daytime  somnolence, significant nocturia and recurrent morning headaches, nonrestorative sleep.  She is advised that we will call her with her results and she may be able to start treatment with a new CPAP or AutoPap machine.  Until she gets her new machine, she may be able to use her old machine with new supplies.  We will pick up our discussion after testing.  I answered all her questions today and she was in agreement with this approach.  Thank you very much for allowing me to participate in the care of this nice patient. If I can be of any further assistance to you please do not hesitate to call me at [phone].  Sincerely,   Star Age, MD, PhD

## 2020-11-20 NOTE — Patient Instructions (Signed)
It was nice to see you again today.  Based on your symptoms and your exam I believe you are still at risk for obstructive sleep apnea and would benefit from reevaluation as it has been many years and you need new supplies and you should qualify for new machine as well.  Given your weight loss, your sleep apnea may have become less severe as well.    I think we should proceed with a sleep study to determine how severe your sleep apnea is. If you have more than mild OSA, I want you to consider ongoing treatment with CPAP. Please remember, the risks and ramifications of moderate to severe obstructive sleep apnea or OSA are: Cardiovascular disease, including congestive heart failure, stroke, difficult to control hypertension, arrhythmias, and even type 2 diabetes has been linked to untreated OSA. Sleep apnea causes disruption of sleep and sleep deprivation in most cases, which, in turn, can cause recurrent headaches, problems with memory, mood, concentration, focus, and vigilance. Most people with untreated sleep apnea report excessive daytime sleepiness, which can affect their ability to drive. Please do not drive if you feel sleepy.   I will likely see you back after your sleep study to go over the test results and where to go from there. We will call you after your sleep study to advise about the results (most likely, you will hear from my nurse) and to set up an appointment at the time, as necessary.    Our sleep lab administrative assistant will call you to schedule your sleep study. If you don't hear back from her by about 2 weeks from now, please feel free to call her at [phone]. You can leave a message with your phone number and concerns, if you get the voicemail box. She will call back as soon as possible.

## 2020-12-29 ENCOUNTER — Telehealth: Payer: Self-pay

## 2020-12-29 NOTE — Telephone Encounter (Signed)
LVM for pt to call me back to schedule sleep study  

## 2021-01-17 ENCOUNTER — Ambulatory Visit (INDEPENDENT_AMBULATORY_CARE_PROVIDER_SITE_OTHER): Payer: Medicare PPO | Admitting: Neurology

## 2021-01-17 DIAGNOSIS — R519 Headache, unspecified: Secondary | ICD-10-CM

## 2021-01-17 DIAGNOSIS — R351 Nocturia: Secondary | ICD-10-CM

## 2021-01-17 DIAGNOSIS — G4719 Other hypersomnia: Secondary | ICD-10-CM

## 2021-01-17 DIAGNOSIS — G4733 Obstructive sleep apnea (adult) (pediatric): Secondary | ICD-10-CM | POA: Diagnosis not present

## 2021-01-17 DIAGNOSIS — R634 Abnormal weight loss: Secondary | ICD-10-CM

## 2021-01-22 NOTE — Progress Notes (Signed)
See procedure note.

## 2021-01-31 ENCOUNTER — Telehealth: Payer: Self-pay | Admitting: *Deleted

## 2021-01-31 ENCOUNTER — Encounter: Payer: Self-pay | Admitting: *Deleted

## 2021-01-31 NOTE — Telephone Encounter (Signed)
Note Spoke with patient and discussed sleep study results.  Patient aware her home sleep test showed obstructive sleep apnea in the moderate range.  Patient agrees to proceed with AutoPap she is aware of compliance requirements by insurance which includes using the machine for at least 4 hours every night and also being seen in the office for a follow-up 30 to 90 days after set up.  Patient ok with using [employer] again.  Advised her we would send an order over to [employer] and the representative which show her how to use the machine, fit her with a mask, and show her how to put the mask on, etc. Patient prefers to give [place] a call to schedule the initial f/u and I encouraged her to do that as soon as she receives her new machine.  Patient confirmed her address for me to send a letter to her home.   Sleep study results sent to PCP. Letter mailed to pt. Order sent securely to [employer].

## 2021-01-31 NOTE — Addendum Note (Signed)
Addended by: Huston Foley on: 01/31/2021 12:51 PM   Modules accepted: Orders

## 2021-01-31 NOTE — Telephone Encounter (Signed)
-----   Message from Huston Foley, MD sent at 01/31/2021 12:51 PM EDT ----- Patient referred by Dr. Leavy Cella for reevaluation of her OSA, seen by me on 11/20/2020, patient had a HST on 01/17/2021.    Please call and notify the patient that the recent home sleep test showed obstructive sleep apnea in the moderate range. I recommend treatment in the form of autoPAP, which means, that we don't have to bring her in for a sleep study with CPAP, but will let her start using a so called autoPAP machine at home, which is a CPAP-like machine with self-adjusting pressures. We will send the order to a local DME company (of her choice, or as per insurance requirement).  She should qualify for new equipment.  Her original CPAP machine was from 2015 I believe.    The DME representative will fit her with a mask, educate her on how to use the machine, how to put the mask on, etc. I have placed an order in the chart. Please send the order, talk to patient, send report to referring MD. We will need a FU in sleep clinic for 10 weeks post-PAP set up, please arrange that with me or one of our NPs. Also reinforce the need for compliance with treatment. Thanks,   Huston Foley, MD, PhD [facility])

## 2021-01-31 NOTE — Procedures (Signed)
   [facility]  HOME SLEEP TEST (Watch PAT) REPORT  STUDY DATE: 01/17/2021  DOB: 06-Jan-1951  MRN: [id number]  ORDERING CLINICIAN: Huston Foley, MD, PhD   REFERRING CLINICIAN: Verlon Au, MD   CLINICAL INFORMATION/HISTORY:  70 year old right-handed woman with an underlying medical history of diabetes, hyperlipidemia, hypothyroidism, thyroid cancer with status post thyroidectomy, reflux disease, allergic rhinitis, vitamin D deficiency, and obesity, who presents for reevaluation of her obstructive sleep apnea.  She has not used her CPAP machine in over 2 years.  Epworth sleepiness score: 11/24.  BMI: 33 kg/m  FINDINGS:   Sleep Summary:   Total Recording Time (hours, min): 8 hours, 38 minutes  Total Sleep Time (hours, min):  6 hours, 7 minutes   Percent REM (%):    21.5%   Respiratory Indices:   Calculated pAHI (per hour):  16.4/hour         REM pAHI:    17.5/hour       NREM pAHI: 16.1/hour  Oxygen Saturation Statistics:    Oxygen Saturation (%) Mean: 94%   Minimum oxygen saturation (%):                 87%   O2 Saturation Range (%): 87-100%    O2 Saturation (minutes) <=88%: 0.6 min  Pulse Rate Statistics:   Pulse Mean (bpm):    71/min    Pulse Range (56-105/min)   IMPRESSION: OSA (obstructive sleep apnea)   RECOMMENDATION:  This home sleep test demonstrates moderate obstructive sleep apnea with a total AHI of 16.4/hour and O2 nadir of 87%.  Mild to moderate snoring was detected, at times in the lower range.  Ongoing treatment with positive airway pressure is recommended, she may qualify for new machine, I would recommend an AutoPap machine. A full night titration study may be considered to optimize treatment settings, if needed down the road.  Alternative treatment options may include a dental device or surgical options.  Concomitant weight loss is recommended.  Please note that untreated obstructive sleep apnea may carry additional  perioperative morbidity. Patients with significant obstructive sleep apnea should receive perioperative PAP therapy and the surgeons and particularly the anesthesiologist should be informed of the diagnosis and the severity of the sleep disordered breathing. The patient should be cautioned not to drive, work at heights, or operate dangerous or heavy equipment when tired or sleepy. Review and reiteration of good sleep hygiene measures should be pursued with any patient. Other causes of the patient's symptoms, including circadian rhythm disturbances, an underlying mood disorder, medication effect and/or an underlying medical problem cannot be ruled out based on this test. Clinical correlation is recommended. The patient and her referring provider will be notified of the test results. The patient will be seen in follow up in sleep clinic at [facility].  I certify that I have reviewed the raw data recording prior to the issuance of this report in accordance with the standards of the American Academy of Sleep Medicine (AASM).  INTERPRETING PHYSICIAN:   Huston Foley, MD, PhD  Board Certified in Neurology and Sleep Medicine  [facility] [address], [address] [place], [place] [address] [phone]

## 2021-06-04 ENCOUNTER — Telehealth: Payer: Self-pay | Admitting: Neurology

## 2021-06-04 NOTE — Telephone Encounter (Signed)
Reschedule pt with Amy, NP on 06/06/21

## 2021-06-04 NOTE — Telephone Encounter (Signed)
The appt needs to be rescheduled because Dr Rexene Alberts will not be here that week. Please call the patient back and reschedule the patient for another day by June 20, 2021. She can see any NP or Dr Rexene Alberts, whomever has first availability. She needs to be seen by 06/20/2021.

## 2021-06-04 NOTE — Telephone Encounter (Signed)
Pt called to confirm appt, informed pt there is a indication appt needs to be rescheduled. Pt would like to discuss why appt for Initial CPAP needs to be rescheduled.

## 2021-06-05 NOTE — Progress Notes (Signed)
? ? ?PATIENT: Natalie Vincent ?DOB: [birth year] ? ?REASON FOR VISIT: follow up ?HISTORY FROM: patient ? ?Chief Complaint  ?Patient presents with  ? Obstructive Sleep Apnea  ?  Rm 11, alone. Here for initial CPAP f/u. Pt reports doing well. Masks is a little to big and not sealing properly. Pt has been waking up to adjust it.   ?  ? ?HISTORY OF PRESENT ILLNESS: ? ?06/06/21 ALL:  ?Natalie Vincent is a 71 y.o. female here today for follow up for OSA on CPAP. She was seen in consult with Dr Rexene Alberts 11/20/2020. Repeat HST confirmed OSA with total AHI of 16.4/hr and O2 nadir of 87%. AutoPAP ordered. Her download below demonstrates great compliance for days used and for greater than 4 hours. She reports that she has some issues with her mask not fitting well. She has not replaced her mask in the past 3 months. She does have a significant leak in the 95th percentile 23.1 L/min. Her AHI is 3.3 on 6 -11 cmH20.  ?She has some question about Prevagen for some minor word finding concerns.  ? ? ? ?HISTORY: (copied from Dr Guadelupe Sabin previous note) ? ?Dear Dr. Luciana Axe, ? ?I saw your patient Natalie Vincent, upon your kind request in my sleep clinic today for reevaluation of her sleep apnea.  The patient is unaccompanied today.  As you know, Natalie Vincent is a 71 year old right-handed woman with an underlying medical history of diabetes, hyperlipidemia, hypothyroidism, thyroid cancer with status post thyroidectomy, reflux disease, allergic rhinitis, vitamin D deficiency, and obesity, who reports that she has not been using her CPAP machine for the past nearly 2-1/2 years.  She stopped using her machine before the pandemic.  She reports that she received new supplies but there was a moldy smell to the supplies so she was scared to use it.  She has had some weight fluctuation, currently about 14 pounds less than what she weighed in 2015 when she had a sleep study with [place].  Her split-night sleep study from  10/16/2013 indicated severe sleep apnea with a baseline AHI of 38.7/h, O2 nadir 82%.  She was placed on CPAP of 9 cm as she did well with it at the time.  Her DME company is [employer].  She reports not sleeping well.  She has daytime somnolence and wakes up multiple times to go to the bathroom.  She tries to be in bed around 11 and rise time is currently around 8.  She has nocturia about 4-5 times per average night and has woken up with a headache.  She does not feel rested.  She has had recent stress due to her husband's fall and hip fracture.  He had a hip replacement.   ?She is a non-smoker and drinks caffeine in the form of coffee, about 2 cups in the mornings.  She does not currently drink any alcohol.  They have 1 dog in the household.  Her Epworth sleepiness score is 11 out of 24, fatigue severity score is 36 out of 63. ? ? ?REVIEW OF SYSTEMS: Out of a complete 14 system review of symptoms, the patient complains only of the following symptoms, word finding and all other reviewed systems are negative. ? ?ESS:11 ? ?ALLERGIES: ?Allergies  ?Allergen Reactions  ? Elemental Sulfur   ?  Reaction unknown  ? ? ?HOME MEDICATIONS: ?Outpatient Medications Prior to Visit  ?Medication Sig Dispense Refill  ? atorvastatin (LIPITOR) 20 MG tablet Take 1 tablet by mouth daily.    ?  cholecalciferol (VITAMIN D3) 25 MCG (1000 UNIT) tablet Three times a week    ? levothyroxine (SYNTHROID) 137 MCG tablet Take by mouth.    ? METFORMIN HCL ER, OSM, PO Take 500mg  in AM and 1000mg  in PM    ? omeprazole (PRILOSEC) 40 MG capsule Take 40 mg by mouth daily.    ? valsartan (DIOVAN) 160 MG tablet Take 160 mg by mouth daily.    ? Cyanocobalamin (VITAMIN B 12 PO) Take by mouth. Three times a week.    ? ?No facility-administered medications prior to visit.  ? ? ?PAST MEDICAL HISTORY: ?Past Medical History:  ?Diagnosis Date  ? COVID-19 10/25/2020  ? Diabetes ([place])   ? Hypercholesteremia   ? Migraine   ? ? ?PAST SURGICAL HISTORY: ?Past Surgical  History:  ?Procedure Laterality Date  ? ABDOMINAL HYSTERECTOMY    ? CERVICAL DISC SURGERY    ? titanium rod  ? ? ?FAMILY HISTORY: ?Family History  ?Problem Relation Age of Onset  ? Diabetes Mother   ? Kidney failure Mother   ? Cancer Father   ? Cancer Brother   ? Cancer Sister   ? Cancer Paternal Uncle   ? Breast cancer Neg Hx   ? ? ?SOCIAL HISTORY: ?Social History  ? ?Socioeconomic History  ? Marital status: Married  ?  Spouse name: Sam  ? Number of children: 2  ? Years of education: 15  ? Highest education level: Not on file  ?Occupational History  ?  Comment: [employer]  ?Tobacco Use  ? Smoking status: Never  ? Smokeless tobacco: Never  ?Substance and Sexual Activity  ? Alcohol use: No  ?  Alcohol/week: 0.0 standard drinks  ? Drug use: No  ? Sexual activity: Not on file  ?Other Topics Concern  ? Not on file  ?Social History Narrative  ? Patient consumes 4 -5 cups of caffeine daily.  ? ?Social Determinants of Health  ? ?Financial Resource Strain: Not on file  ?Food Insecurity: Not on file  ?Transportation Needs: Not on file  ?Physical Activity: Not on file  ?Stress: Not on file  ?Social Connections: Not on file  ?Intimate Partner Violence: Not on file  ? ? ? ?PHYSICAL EXAM ? ?Vitals:  ? 06/06/21 0848  ?BP: (!) 163/72  ?Pulse: 80  ?Weight: 96.8 kg  ?Height: 5' 5.75" (1.67 m)  ? ?Body mass index is 34.72 kg/m?. ? ?Generalized: Well developed, in no acute distress  ?Cardiology: normal rate and rhythm, no murmur noted ?Respiratory: clear to auscultation bilaterally  ?Neurological examination  ?Mentation: Alert oriented to time, place, history taking. Follows all commands speech and language fluent ?Cranial nerve II-XII: Pupils were equal round reactive to light. Extraocular movements were full, visual field were full  ?Motor: The motor testing reveals 5 over 5 strength of all 4 extremities. Good symmetric motor tone is noted throughout.  ?Gait and station: Gait is normal.  ? ? ?DIAGNOSTIC DATA (LABS, IMAGING,  TESTING) ?- I reviewed patient records, labs, notes, testing and imaging myself where available. ? ?No flowsheet data found.  ? ?Lab Results  ?Component Value Date  ? WBC 8.8 06/08/2014  ? HGB 11.0 (L) 06/08/2014  ? HCT 34.4 (L) 06/08/2014  ? MCV 81.7 06/08/2014  ? PLT 279 06/08/2014  ? ?   ?Component Value Date/Time  ? NA 138 06/08/2014 0152  ? K 3.8 06/08/2014 0152  ? CL 107 06/08/2014 0152  ? CO2 27 06/08/2014 0152  ? GLUCOSE 183 (H) 06/08/2014 0152  ?  BUN 13 06/08/2014 0152  ? CREATININE 0.86 06/08/2014 0152  ? CALCIUM 8.8 06/08/2014 0152  ? PROT 6.7 06/07/2014 2145  ? ALBUMIN 3.9 06/07/2014 2145  ? AST 21 06/07/2014 2145  ? ALT 17 06/07/2014 2145  ? ALKPHOS 48 06/07/2014 2145  ? BILITOT 0.5 06/07/2014 2145  ? GFRNONAA 71 (L) 06/08/2014 0152  ? GFRAA 82 (L) 06/08/2014 0152  ? ?No results found for: CHOL, HDL, LDLCALC, LDLDIRECT, TRIG, CHOLHDL ?No results found for: HGBA1C ?No results found for: VITAMINB12 ?No results found for: TSH ? ? ?ASSESSMENT AND PLAN ?71 y.o. year old female  has a past medical history of COVID-19 (10/25/2020), Diabetes ([place]), Hypercholesteremia, and Migraine. here with  ? ?  ICD-10-CM   ?1. OSA on CPAP  G47.33   ? Z99.89   ?  ?  ? ? ?Antione Klepp is doing well on CPAP therapy. Compliance report reveals good compliance. She was encouraged to continue using CPAP nightly and for greater than 4 hours each night. We will update supply orders and order a mask refitting at this time. We encouraged her to change her mask every month. Risks of untreated sleep apnea review and education materials provided. Healthy lifestyle habits encouraged. We discussed the Prevagen and felt this would be okay to try. She will follow up with her primary care if she feels her memory becomes a concern. She will follow up in 1 year, sooner if needed. She verbalizes understanding and agreement with this plan.  ? ? ?No orders of the defined types were placed in this encounter. ?  ? ?No orders of the defined  types were placed in this encounter. ?  ? ? ?Debbora Presto, FNP-C 06/06/2021, 9:31 AM ?[facility] ?[address] ?[place], [place] [address] ?([phone] ? ?

## 2021-06-05 NOTE — Patient Instructions (Addendum)
Please continue using your CPAP regularly. While your insurance requires that you use CPAP at least 4 hours each night on 70% of the nights, I recommend, that you not skip any nights and use it throughout the night if you can. Getting used to CPAP and staying with the treatment long term does take time and patience and discipline. Untreated obstructive sleep apnea when it is moderate to severe can have an adverse impact on cardiovascular health and raise her risk for heart disease, arrhythmias, hypertension, congestive heart failure, stroke and diabetes. Untreated obstructive sleep apnea causes sleep disruption, nonrestorative sleep, and sleep deprivation. This can have an impact on your day to day functioning and cause daytime sleepiness and impairment of cognitive function, memory loss, mood disturbance, and problems focussing. Using CPAP regularly can improve these symptoms. ? ?Switch out your mask every 4 weeks to ensure there is a good seal. Your DME company will reach out to you for a mask refitting.  ? ?Follow up in 1 year, sooner if needed ?

## 2021-06-06 ENCOUNTER — Ambulatory Visit: Payer: Medicare PPO | Admitting: Family Medicine

## 2021-06-06 ENCOUNTER — Encounter: Payer: Self-pay | Admitting: Family Medicine

## 2021-06-06 VITALS — BP 163/72 | HR 80 | Ht 65.75 in | Wt 213.5 lb

## 2021-06-06 DIAGNOSIS — Z9989 Dependence on other enabling machines and devices: Secondary | ICD-10-CM | POA: Diagnosis not present

## 2021-06-06 DIAGNOSIS — G4733 Obstructive sleep apnea (adult) (pediatric): Secondary | ICD-10-CM

## 2021-06-06 NOTE — Progress Notes (Signed)
CM sent to [facility] for new order ?

## 2021-06-18 ENCOUNTER — Ambulatory Visit: Payer: Medicare PPO | Admitting: Neurology

## 2021-10-22 ENCOUNTER — Encounter: Payer: Self-pay | Admitting: Cardiology

## 2021-10-22 ENCOUNTER — Ambulatory Visit: Payer: Medicare PPO | Admitting: Cardiology

## 2021-10-22 VITALS — BP 157/71 | HR 74 | Temp 97.7°F | Resp 16 | Ht 65.0 in | Wt 216.8 lb

## 2021-10-22 DIAGNOSIS — R0602 Shortness of breath: Secondary | ICD-10-CM

## 2021-10-22 DIAGNOSIS — I209 Angina pectoris, unspecified: Secondary | ICD-10-CM

## 2021-10-22 DIAGNOSIS — I10 Essential (primary) hypertension: Secondary | ICD-10-CM

## 2021-10-22 DIAGNOSIS — E119 Type 2 diabetes mellitus without complications: Secondary | ICD-10-CM

## 2021-10-22 DIAGNOSIS — E7800 Pure hypercholesterolemia, unspecified: Secondary | ICD-10-CM

## 2021-10-22 MED ORDER — NITROGLYCERIN 0.4 MG SL SUBL: 0.4000 mg | SUBLINGUAL_TABLET | SUBLINGUAL | 3 refills | Status: AC | PRN

## 2021-10-22 MED ORDER — AMLODIPINE BESYLATE 5 MG PO TABS: 5.0000 mg | ORAL_TABLET | Freq: Every day | ORAL | 2 refills | Status: AC

## 2021-10-22 NOTE — Progress Notes (Signed)
Primary Physician/Referring:  Bartholome Bill, MD  Patient ID: Natalie Vincent, female    DOB: [birth year], 71 y.o.   MRN: [id number]  Chief Complaint  Patient presents with   Shortness of Breath   New Patient (Initial Visit)   HPI:    Natalie Vincent  is a 71 y.o. African-American female patient who had last seen in 2016, referred to me for evaluation of dyspnea on exertion and exertional chest pain.  Over the past 1 year, every time she walks up a flight of stairs, she feels tightness in the middle of the chest associated with dyspnea and has been concerned about coronary artery disease.  No PND orthopnea.  Her activity has been limited due to back pain and arthritis.   Past Medical History:  Diagnosis Date   COVID-19 10/25/2020   Diabetes ([place])    Hypercholesteremia    Migraine    Past Surgical History:  Procedure Laterality Date   ABDOMINAL HYSTERECTOMY     CERVICAL DISC SURGERY     titanium rod   Family History  Problem Relation Age of Onset   Diabetes Mother    Kidney failure Mother    Cancer Father    Cancer Sister    Cancer Brother    Cancer Paternal Uncle    Breast cancer Neg Hx     Social History   Tobacco Use   Smoking status: Never   Smokeless tobacco: Never  Substance Use Topics   Alcohol use: No    Alcohol/week: 0.0 standard drinks of alcohol   Marital Status: Married  ROS  Review of Systems  Cardiovascular:  Positive for chest pain and dyspnea on exertion. Negative for leg swelling.   Objective  Blood pressure (!) 157/71, pulse 74, temperature 97.7 F (36.5 C), temperature source Temporal, resp. rate 16, height 5' 5"  (1.651 m), weight 216 lb 12.8 oz (98.3 kg), SpO2 100 %. Body mass index is 36.08 kg/m.     10/22/2021    9:23 AM 10/22/2021    9:10 AM 06/06/2021    8:48 AM  Vitals with BMI  Height  5' 5"  5' 5.75"  Weight  216 lbs 13 oz 213 lbs 8 oz  BMI  27.78 24.23  Systolic 536 144 315  Diastolic 71 83 72  Pulse 74 80  80    Physical Exam Constitutional:      Appearance: She is obese.  Neck:     Vascular: No JVD.  Cardiovascular:     Rate and Rhythm: Normal rate and regular rhythm.     Pulses: Intact distal pulses.     Heart sounds: Normal heart sounds. No murmur heard.    No gallop.  Pulmonary:     Effort: Pulmonary effort is normal.     Breath sounds: Normal breath sounds.  Abdominal:     General: Bowel sounds are normal.     Palpations: Abdomen is soft.  Musculoskeletal:     Right lower leg: No edema.     Left lower leg: No edema.     Medications and allergies   Allergies  Allergen Reactions   Elemental Sulfur     Reaction unknown     Medication list after today's encounter   Current Outpatient Medications:    amLODipine (NORVASC) 5 MG tablet, Take 1 tablet (5 mg total) by mouth daily., Disp: 30 tablet, Rfl: 2   atorvastatin (LIPITOR) 20 MG tablet, Take 1 tablet by mouth daily., Disp: , Rfl:    cholecalciferol (  VITAMIN D3) 25 MCG (1000 UNIT) tablet, Three times a week, Disp: , Rfl:    Cyanocobalamin (VITAMIN B 12 PO), Take by mouth., Disp: , Rfl:    levothyroxine (SYNTHROID) 137 MCG tablet, Take by mouth., Disp: , Rfl:    METFORMIN HCL ER, OSM, PO, Take 569m in AM and 10062min PM, Disp: , Rfl:    nitroGLYCERIN (NITROSTAT) 0.4 MG SL tablet, Place 1 tablet (0.4 mg total) under the tongue every 5 (five) minutes as needed for up to 25 days for chest pain., Disp: 25 tablet, Rfl: 3   omeprazole (PRILOSEC) 40 MG capsule, Take 40 mg by mouth daily., Disp: , Rfl:    valsartan (DIOVAN) 160 MG tablet, Take 160 mg by mouth daily., Disp: , Rfl:   Laboratory examination:   External labs:   CHOL 174 11/17/2020  TRIG 99 11/17/2020  HDL 67 11/17/2020  LDL 87 11/17/2020   Lab Results  Component Value Date  HBA1C 6.7 (H) 10/02/2021  TSH 0.45 - 5.33 UIU/ML 10/02/2021 0.34 Low     Labs 10/02/2021:  Hb 12.0/HCT 36.1, platelets 269, normal indicis.  Serum ferritin markedly reduced at 10.0  (20-200 ng/mL.  Percentage iron saturation 14 reduced (15-50%.  Sodium 140, potassium 4.3, BUN 15, creatinine 1.0, EGFR 61 mL, LFTs normal.   Radiology:   Chest x-ray PA lateral view 10/21/2019: Normal heart size and vascularity. Clear lungs. No focal pneumonia,  collapse or consolidation. Negative for edema, effusion, or  pneumothorax. Trachea midline. Lower cervical fusion hardware  evident. Degenerative changes of thoracic spine. No acute  compression fracture or focal kyphosis.    Cardiac Studies:   Echocardiogram 06/08/2014: - Left ventricle: The cavity size was normal. There was mild    concentric hypertrophy. Systolic function was vigorous. The    estimated ejection fraction was in the range of 65% to 70%. Wall    motion was normal; there were no regional wall motion    abnormalities. Doppler parameters are consistent with abnormal    left ventricular relaxation (grade 1 diastolic dysfunction).  - Aortic valve: Mildly calcified annulus. Trileaflet; mildly    thickened leaflets. There was no stenosis.  - Mitral valve: Mildly thickened leaflets . There was trivial   regurgitation.  Carotid artery duplex 08/31/2014: Mild stenosis in the left external carotid artery (<50%). No ICA stenosis. Follow up in one year is appropriate if clinically indicated. Mild plaque noted bilateral.  EKG:   EKG 10/22/2021: Sinus rhythm with borderline short PR interval, PR interval 120 seconds at rate of 73 bpm, normal axis, nonspecific T abnormality.  No significant change from 06/07/2014 and 10/02/2021  Assessment     ICD-10-CM   1. Shortness of breath  R06.02 EKG 12-Lead    PCV ECHOCARDIOGRAM COMPLETE    PCV MYOCARDIAL PERFUSION WO LEXISCAN    2. Angina pectoris (HCC)  I20.9 PCV ECHOCARDIOGRAM COMPLETE    PCV MYOCARDIAL PERFUSION WO LEXISCAN    nitroGLYCERIN (NITROSTAT) 0.4 MG SL tablet    3. Primary hypertension  I10 amLODipine (NORVASC) 5 MG tablet    4. Hypercholesteremia  E78.00      5. Type 2 diabetes mellitus without complication, without long-term current use of insulin ([place] E11.9        Orders Placed This Encounter  Procedures   PCV MYOCARDIAL PERFUSION WO LEXISCAN    Standing Status:   Future    Standing Expiration Date:   12/23/2021   EKG 12-Lead   PCV ECHOCARDIOGRAM COMPLETE  Standing Status:   Future    Standing Expiration Date:   10/23/2022    Meds ordered this encounter  Medications   nitroGLYCERIN (NITROSTAT) 0.4 MG SL tablet    Sig: Place 1 tablet (0.4 mg total) under the tongue every 5 (five) minutes as needed for up to 25 days for chest pain.    Dispense:  25 tablet    Refill:  3   amLODipine (NORVASC) 5 MG tablet    Sig: Take 1 tablet (5 mg total) by mouth daily.    Dispense:  30 tablet    Refill:  2    There are no discontinued medications.   Recommendations:   Shia Delaine is a 71 y.o.  African-American female patient who had last seen in 2016, referred to me for evaluation of dyspnea on exertion and exertional chest pain.  Over the past 1 year, every time she walks up a flight of stairs, she feels tightness in the middle of the chest associated with dyspnea and has been concerned about coronary artery disease.  No PND orthopnea.  Her activity has been limited due to back pain and arthritis.  Physical examination is unremarkable except for obesity.  In view of markedly elevated CV risk, we will schedule her for a exercise nuclear stress test and an echocardiogram.  I prescribed her NTG.  Blood pressure is elevated at home and also here, both for angina pectoris and elevated blood pressure, will add amlodipine 5 mg daily.  External labs reviewed, her LDL is not at goal, I will address this on her next office visit as patient is slightly reluctant to making medication changes.  Diabetes is well controlled without complications.    Adrian Prows, MD, [facility] 10/22/2021, 10:19 AM Office: [phone]

## 2021-11-16 ENCOUNTER — Ambulatory Visit: Payer: Medicare PPO

## 2021-11-16 DIAGNOSIS — R0602 Shortness of breath: Secondary | ICD-10-CM

## 2021-11-16 DIAGNOSIS — I209 Angina pectoris, unspecified: Secondary | ICD-10-CM

## 2021-11-19 ENCOUNTER — Other Ambulatory Visit: Payer: Medicare PPO

## 2021-12-05 ENCOUNTER — Ambulatory Visit: Payer: Medicare PPO

## 2021-12-05 DIAGNOSIS — I209 Angina pectoris, unspecified: Secondary | ICD-10-CM

## 2021-12-05 DIAGNOSIS — R0602 Shortness of breath: Secondary | ICD-10-CM

## 2021-12-12 ENCOUNTER — Ambulatory Visit: Payer: Medicare PPO | Admitting: Cardiology

## 2021-12-12 ENCOUNTER — Encounter: Payer: Self-pay | Admitting: Cardiology

## 2021-12-12 VITALS — BP 142/80 | HR 79 | Temp 98.2°F | Resp 16 | Ht 65.0 in | Wt 215.4 lb

## 2021-12-12 DIAGNOSIS — E7800 Pure hypercholesterolemia, unspecified: Secondary | ICD-10-CM

## 2021-12-12 DIAGNOSIS — R0602 Shortness of breath: Secondary | ICD-10-CM

## 2021-12-12 DIAGNOSIS — I10 Essential (primary) hypertension: Secondary | ICD-10-CM

## 2021-12-12 DIAGNOSIS — I209 Angina pectoris, unspecified: Secondary | ICD-10-CM

## 2021-12-12 NOTE — Progress Notes (Signed)
Primary Physician/Referring:  Bartholome Bill, MD  Patient ID: Natalie Vincent, female    DOB: [birth year], 71 y.o.   MRN: [id number]  Chief Complaint  Patient presents with   Chest Pain   dyspnea on exertion   Follow-up   HPI:    Natalie Vincent  is a 71 y.o. African-American female patient presents for follow-up of of dyspnea on exertion and exertional chest pain.  Over the past 1 year, every time she walks up a flight of stairs, she feels tightness in the middle of the chest associated with dyspnea and has been concerned about coronary artery disease.  No PND orthopnea.  Her activity has been limited due to back pain and arthritis.   Past Medical History:  Diagnosis Date   COVID-19 10/25/2020   Diabetes ([place])    Hypercholesteremia    Migraine    Past Surgical History:  Procedure Laterality Date   ABDOMINAL HYSTERECTOMY     CERVICAL DISC SURGERY     titanium rod   Family History  Problem Relation Age of Onset   Diabetes Mother    Kidney failure Mother    Cancer Father    Cancer Sister    Cancer Brother    Cancer Paternal Uncle    Breast cancer Neg Hx     Social History   Tobacco Use   Smoking status: Never   Smokeless tobacco: Never  Substance Use Topics   Alcohol use: No    Alcohol/week: 0.0 standard drinks of alcohol   Marital Status: Married  ROS  Review of Systems  Cardiovascular:  Positive for chest pain and dyspnea on exertion. Negative for leg swelling.  Gastrointestinal:  Positive for heartburn.   Objective  Blood pressure (!) 142/80, pulse 79, temperature 98.2 F (36.8 C), temperature source Temporal, resp. rate 16, height _0  (1.651 m), weight 215 lb 6.4 oz (97.7 kg), SpO2 98 %. Body mass index is 35.84 kg/m.     12/12/2021   10:06 AM 12/12/2021   10:05 AM 10/22/2021    9:23 AM  Vitals with BMI  Height  _1    Weight  215 lbs 6 oz   BMI  01.56   Systolic 153 794 327  Diastolic 80 69 71  Pulse 79 89 74    Physical  Exam Constitutional:      Appearance: She is obese.  Neck:     Vascular: No JVD.  Cardiovascular:     Rate and Rhythm: Normal rate and regular rhythm.     Pulses: Intact distal pulses.     Heart sounds: Normal heart sounds. No murmur heard.    No gallop.  Pulmonary:     Effort: Pulmonary effort is normal.     Breath sounds: Normal breath sounds.  Abdominal:     General: Bowel sounds are normal.     Palpations: Abdomen is soft.  Musculoskeletal:     Right lower leg: No edema.     Left lower leg: No edema.    Medications and allergies   Allergies  Allergen Reactions   Elemental Sulfur     Reaction unknown     Medication list after today's encounter   Current Outpatient Medications:    amLODipine (NORVASC) 5 MG tablet, Take 1 tablet (5 mg total) by mouth daily., Disp: 30 tablet, Rfl: 2   atorvastatin (LIPITOR) 20 MG tablet, Take 1 tablet by mouth daily., Disp: , Rfl:    cholecalciferol (VITAMIN D3) 25 MCG (1000 UNIT) tablet,  Three times a week, Disp: , Rfl:    Cyanocobalamin (VITAMIN B 12 PO), Take by mouth., Disp: , Rfl:    levothyroxine (SYNTHROID) 137 MCG tablet, Take by mouth., Disp: , Rfl:    METFORMIN HCL ER, OSM, PO, Take 535m in AM and 10019min PM, Disp: , Rfl:    nitroGLYCERIN (NITROSTAT) 0.4 MG SL tablet, Place 1 tablet (0.4 mg total) under the tongue every 5 (five) minutes as needed for up to 25 days for chest pain., Disp: 25 tablet, Rfl: 3   omeprazole (PRILOSEC) 40 MG capsule, Take 40 mg by mouth daily., Disp: , Rfl:    valsartan (DIOVAN) 160 MG tablet, Take 160 mg by mouth daily., Disp: , Rfl:   Laboratory examination:   External labs:   CHOL 174 11/17/2020  TRIG 99 11/17/2020  HDL 67 11/17/2020  LDL 87 11/17/2020   Lab Results  Component Value Date  HBA1C 6.7 (H) 10/02/2021  TSH 0.45 - 5.33 UIU/ML 10/02/2021 0.34 Low     Labs 10/02/2021:  Hb 12.0/HCT 36.1, platelets 269, normal indicis.  Serum ferritin markedly reduced at 10.0 (20-200 ng/mL.   Percentage iron saturation 14 reduced (15-50%.  Sodium 140, potassium 4.3, BUN 15, creatinine 1.0, EGFR 61 mL, LFTs normal.   Radiology:   Chest x-ray PA lateral view 10/21/2019: Normal heart size and vascularity. Clear lungs. No focal pneumonia,  collapse or consolidation. Negative for edema, effusion, or  pneumothorax. Trachea midline. Lower cervical fusion hardware  evident. Degenerative changes of thoracic spine. No acute  compression fracture or focal kyphosis.    Cardiac Studies:   Carotid artery duplex 05Jun 16, 2016Mild stenosis in the left external carotid artery (<50%). No ICA stenosis. Follow up in one year is appropriate if clinically indicated. Mild plaque noted bilateral.  PCV ECHOCARDIOGRAM COMPLETE 11/16/2021  Left ventricle cavity is normal in size. Mild concentric hypertrophy of the left ventricle. Normal global wall motion. Normal LV systolic function with EF 70%. Doppler evidence of grade I (impaired) diastolic dysfunction, normal LAP. Mild tricuspid regurgitation. No evidence of pulmonary hypertension.    PCV MYOCARDIAL PERFUSION WO LEXISCAN 12/05/2021  Narrative Exercise nuclear stress test 12/05/21 Myocardial perfusion is normal. Low risk study. Overall LV systolic function is normal without regional wall motion abnormalities. Stress LV EF: 68%. Normal ECG stress. The patient exercised for 2 minutes and 4 seconds of a Bruce protocol, achieving approximately 4.64 METs and 97% MPHR. The heart rate response was normal.  The blood pressure response was normal. No previous exam available for comparison.   EKG:   EKG 10/22/2021: Sinus rhythm with borderline short PR interval, PR interval 120 seconds at rate of 73 bpm, normal axis, nonspecific T abnormality.  No significant change from 06/07/2014 and 10/02/2021  Assessment     ICD-10-CM   1. Angina pectoris (HCC)  I20.9     2. Shortness of breath  R06.02     3. Hypercholesteremia  E78.00     4. Primary  hypertension  I10       No orders of the defined types were placed in this encounter.  No orders of the defined types were placed in this encounter.  There are no discontinued medications.   Recommendations:   CyJamelyn Bovards a [age].o.  African-American female patient presents for follow-up of of dyspnea on exertion and exertional chest pain.  Over the past 1 year, every time she walks up a flight of stairs, she feels tightness in the middle of the chest  associated with dyspnea and has been concerned about coronary artery disease.  I reviewed the details of the stress test.  Patient has markedly reduced exercise tolerance, exercised only <3 minutes and achieved target heart rate.  Suspect diastolic dysfunction, chronic diastolic heart failure to be etiology for dyspnea.  With regard to chest discomfort, she has been having frequent sour eructation and also frequent belching and suspect GI etiology.  She is presently taking omeprazole after dinner, advised her to take the medication 30 minutes before meal, but for now in view of increased sour rotation and belching, advised her to take it twice daily for a week.  I also reviewed the results of the echocardiogram, which again reveals diastolic dysfunction but preserved LVEF.  Hence I reassured her.  Her husband is present.  I discussed with him regarding primary prevention, regular walking on a daily basis.  I had added amlodipine for hypertension.  Blood pressure is significantly improved from 562 mmHg systolic to the present 563 mmHg systolic, patient states that her blood pressure has been well controlled at home and she would prefer not to change anything for now until she has an appointment to see her PCP at which time if the blood pressure is still elevated >130 mmHg, she would increase amlodipine to 10 mg daily.  Patient is hesitant to make any changes to her medications, lipids are at goal, overall stable from cardiac standpoint, I  will see her back on a as needed basis.  All questions answered.     Adrian Prows, MD, [facility] 12/12/2021, 9:46 PM Office: [phone]

## 2022-01-25 IMAGING — MG MM DIGITAL SCREENING BILAT W/ TOMO AND CAD
8 series · 8 of 24 positions shown · non-contrast
Comparison: Previous exam(s).

CLINICAL DATA: Screening.

EXAM:
DIGITAL SCREENING BILATERAL MAMMOGRAM WITH TOMOSYNTHESIS AND CAD
TECHNIQUE: Bilateral screening digital craniocaudal and mediolateral oblique
mammograms were obtained. Bilateral screening digital breast
tomosynthesis was performed. The images were evaluated with
computer-aided detection.

[R MLO synth-2D]
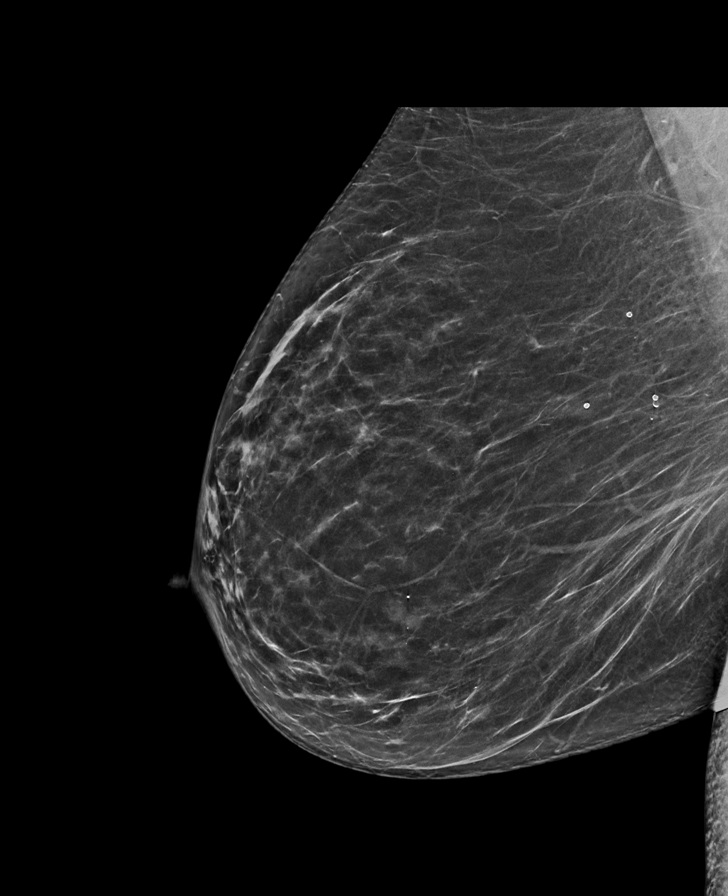

[L CC synth-2D]
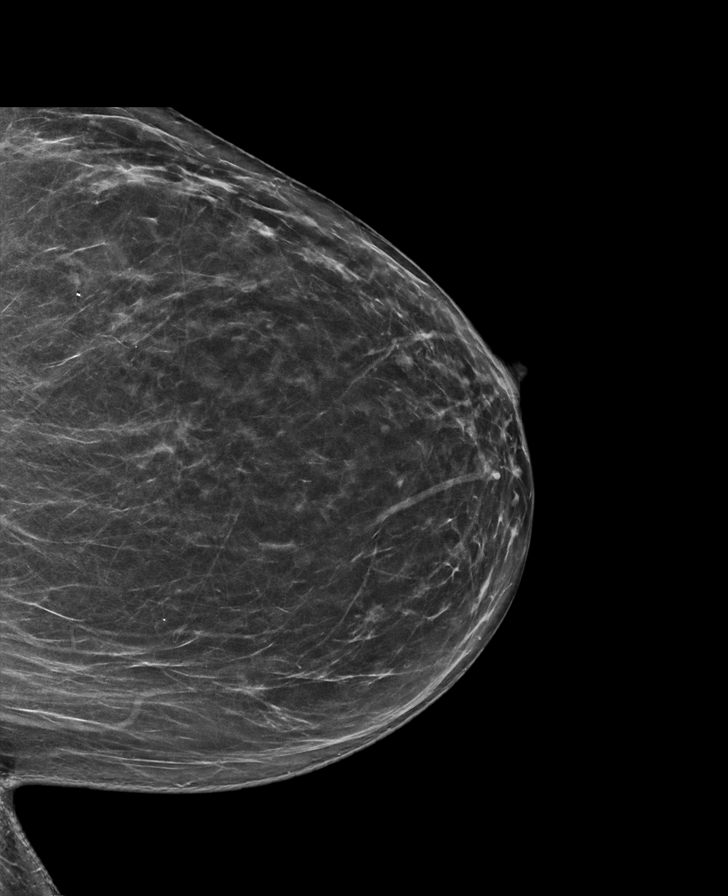

[R CC synth-2D]
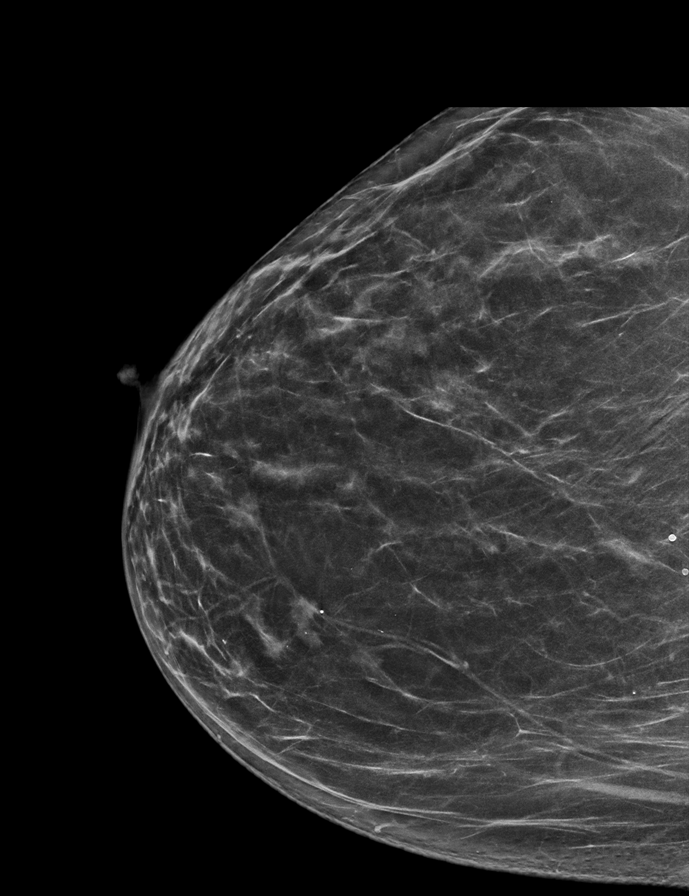

[L MLO synth-2D]
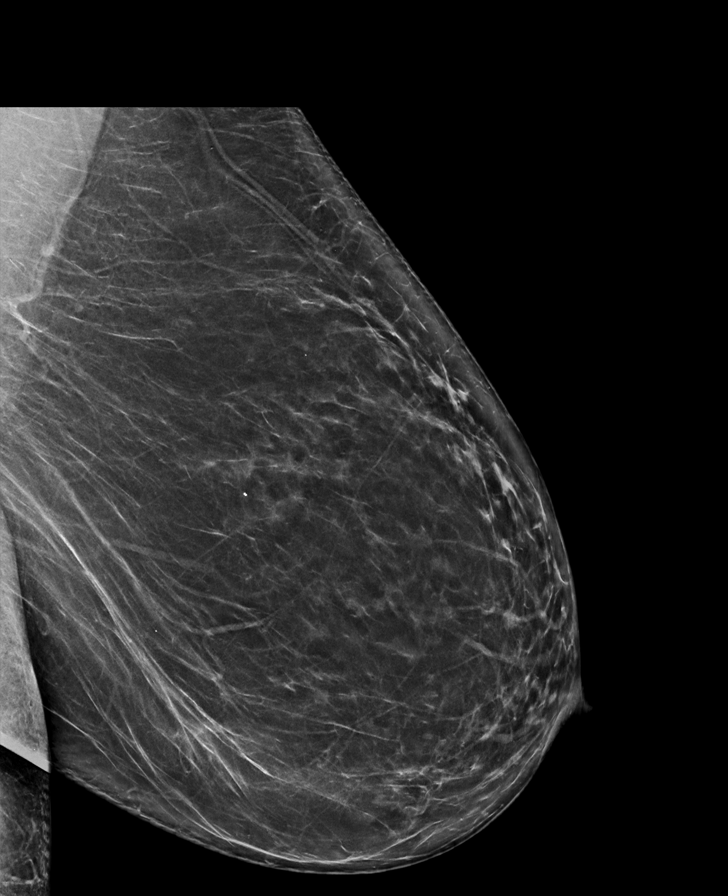

[R MLO tomo · tomo slice 37/72.0]
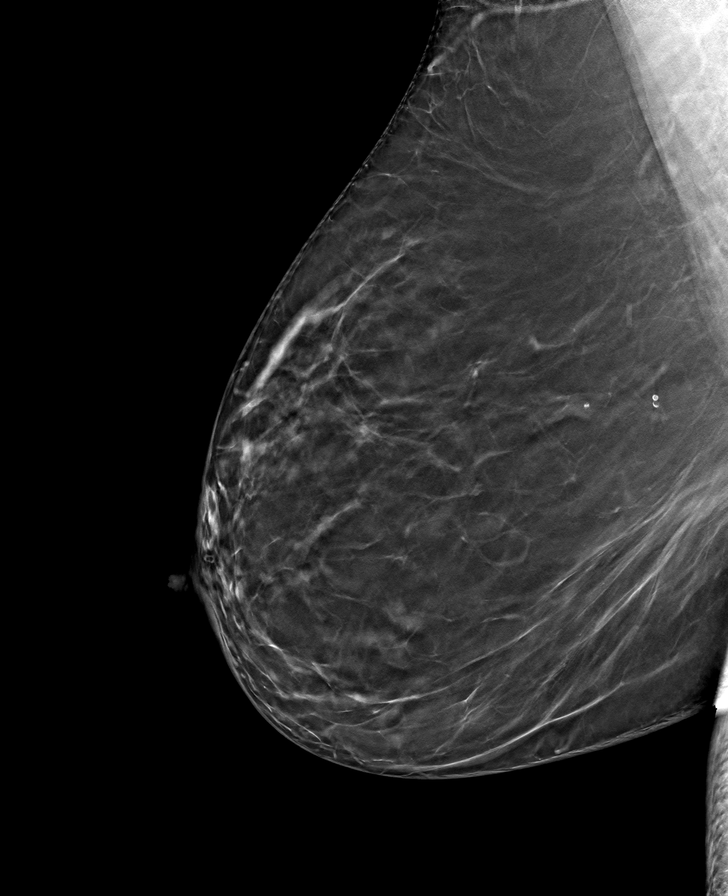

[L MLO tomo · tomo slice 42/83.0]
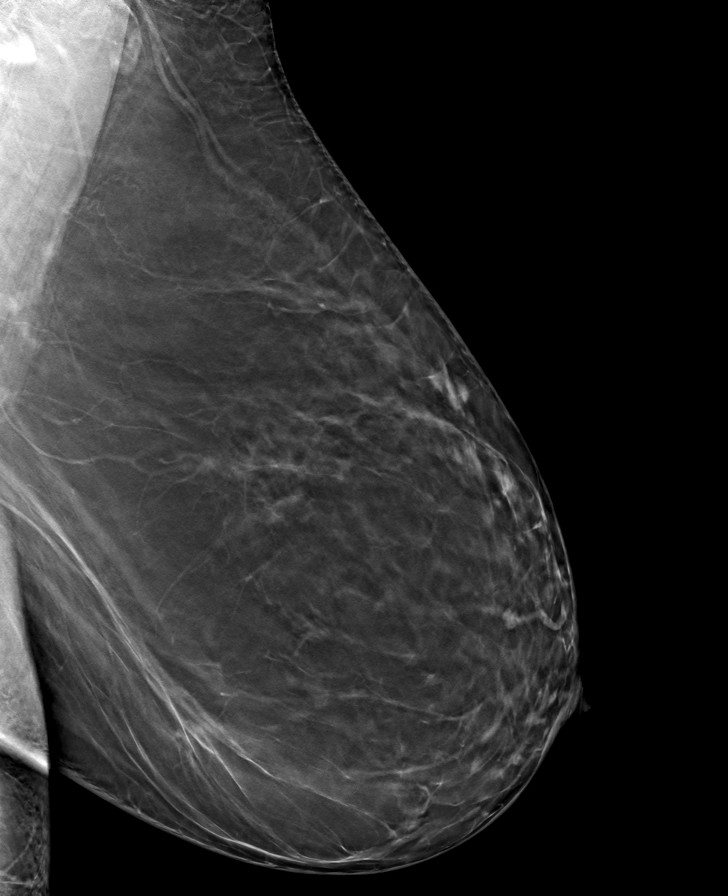

[R CC tomo · tomo slice 34/67.0]
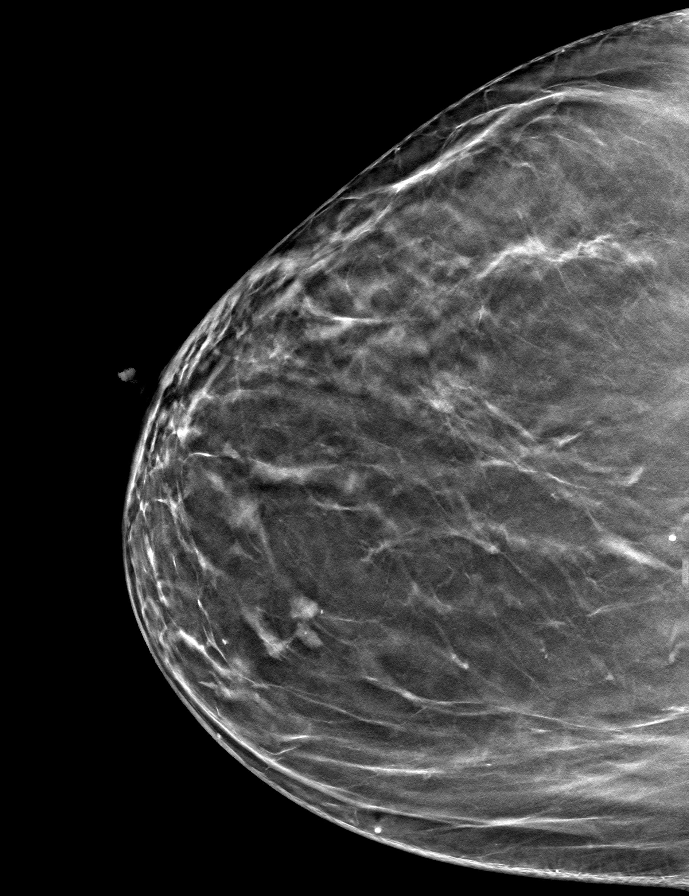

[L CC tomo · tomo slice 36/71.0]
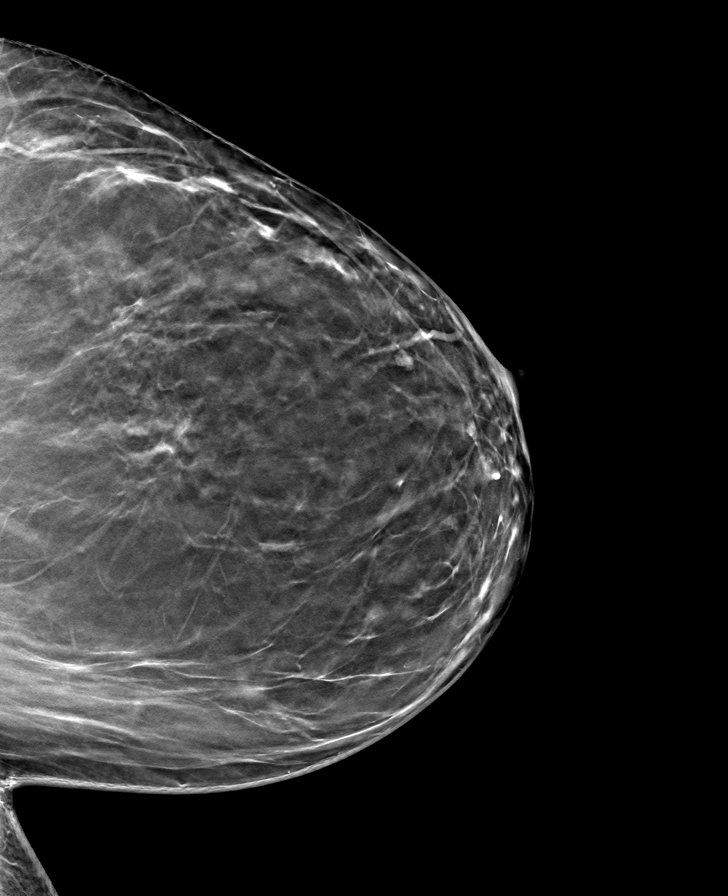

[8 of 24 positions shown; findings below may reference images not displayed]

ACR Breast Density Category b: There are scattered areas of
fibroglandular density.
FINDINGS: There are no findings suspicious for malignancy. The images were
evaluated with computer-aided detection.
IMPRESSION: No mammographic evidence of malignancy. A result letter of this
screening mammogram will be mailed directly to the patient.

RECOMMENDATION:
Screening mammogram in one year. (Code:[id number])

BI-RADS CATEGORY  1: Negative.

## 2022-01-30 ENCOUNTER — Other Ambulatory Visit: Payer: Self-pay | Admitting: Family Medicine

## 2022-01-30 DIAGNOSIS — Z1231 Encounter for screening mammogram for malignant neoplasm of breast: Secondary | ICD-10-CM

## 2022-03-29 ENCOUNTER — Ambulatory Visit
Admission: RE | Admit: 2022-03-29 | Discharge: 2022-03-29 | Disposition: A | Discharge status: Home / Self Care | Payer: Medicare PPO | Source: Ambulatory Visit | Attending: Family Medicine | Admitting: Family Medicine

## 2022-03-29 DIAGNOSIS — Z1231 Encounter for screening mammogram for malignant neoplasm of breast: Secondary | ICD-10-CM

## 2022-06-11 NOTE — Progress Notes (Unsigned)
PATIENT: Natalie Vincent DOB: 1950/10/18  REASON FOR VISIT: follow up HISTORY FROM: patient  No chief complaint on file.    HISTORY OF PRESENT ILLNESS:  06/11/22 ALL:  Natalie Vincent returns for follow up for OSA on CPAP.   06/06/2021 ALL: Natalie Vincent is a 72 y.o. female here today for follow up for OSA on CPAP. She was seen in consult with Dr Rexene Alberts 11/20/2020. Repeat HST confirmed OSA with total AHI of 16.4/hr and O2 nadir of 87%. AutoPAP ordered. Her download below demonstrates great compliance for days used and for greater than 4 hours. She reports that she has some issues with her mask not fitting well. She has not replaced her mask in the past 3 months. She does have a significant leak in the 95th percentile 23.1 L/min. Her AHI is 3.3 on 6 -11 cmH20.  She has some question about Prevagen for some minor word finding concerns.     HISTORY: (copied from Dr Guadelupe Sabin previous note)  Dear Dr. Luciana Axe,  I saw your patient Natalie Vincent, upon your kind request in my sleep clinic today for reevaluation of her sleep apnea.  The patient is unaccompanied today.  As you know, Ms. Natalie Vincent is a 72 year old right-handed woman with an underlying medical history of diabetes, hyperlipidemia, hypothyroidism, thyroid cancer with status post thyroidectomy, reflux disease, allergic rhinitis, vitamin D deficiency, and obesity, who reports that she has not been using her CPAP machine for the past nearly 2-1/2 years.  She stopped using her machine before the pandemic.  She reports that she received new supplies but there was a moldy smell to the supplies so she was scared to use it.  She has had some weight fluctuation, currently about 14 pounds less than what she weighed in 2015 when she had a sleep study with [place].  Her split-night sleep study from 10/16/2013 indicated severe sleep apnea with a baseline AHI of 38.7/h, O2 nadir 82%.  She was placed on CPAP of 9 cm as she did well with it at the time.   Her DME company is [employer].  She reports not sleeping well.  She has daytime somnolence and wakes up multiple times to go to the bathroom.  She tries to be in bed around 11 and rise time is currently around 8.  She has nocturia about 4-5 times per average night and has woken up with a headache.  She does not feel rested.  She has had recent stress due to her husband's fall and hip fracture.  He had a hip replacement.   She is a non-smoker and drinks caffeine in the form of coffee, about 2 cups in the mornings.  She does not currently drink any alcohol.  They have 1 dog in the household.  Her Epworth sleepiness score is 11 out of 24, fatigue severity score is 36 out of 63.   REVIEW OF SYSTEMS: Out of a complete 14 system review of symptoms, the patient complains only of the following symptoms, word finding and all other reviewed systems are negative.  ESS:11  ALLERGIES: Allergies  Allergen Reactions   Elemental Sulfur     Reaction unknown    HOME MEDICATIONS: Outpatient Medications Prior to Visit  Medication Sig Dispense Refill   amLODipine (NORVASC) 5 MG tablet Take 1 tablet (5 mg total) by mouth daily. 30 tablet 2   atorvastatin (LIPITOR) 20 MG tablet Take 1 tablet by mouth daily.     cholecalciferol (VITAMIN D3) 25 MCG (  1000 UNIT) tablet Three times a week     Cyanocobalamin (VITAMIN B 12 PO) Take by mouth.     levothyroxine (SYNTHROID) 137 MCG tablet Take by mouth.     METFORMIN HCL ER, OSM, PO Take '500mg'$  in AM and '1000mg'$  in PM     nitroGLYCERIN (NITROSTAT) 0.4 MG SL tablet Place 1 tablet (0.4 mg total) under the tongue every 5 (five) minutes as needed for up to 25 days for chest pain. 25 tablet 3   omeprazole (PRILOSEC) 40 MG capsule Take 40 mg by mouth daily.     valsartan (DIOVAN) 160 MG tablet Take 160 mg by mouth daily.     No facility-administered medications prior to visit.    PAST MEDICAL HISTORY: Past Medical History:  Diagnosis Date   COVID-19 10/25/2020    Diabetes ([place])    Hypercholesteremia    Migraine     PAST SURGICAL HISTORY: Past Surgical History:  Procedure Laterality Date   ABDOMINAL HYSTERECTOMY     CERVICAL DISC SURGERY     titanium rod    FAMILY HISTORY: Family History  Problem Relation Age of Onset   Diabetes Mother    Kidney failure Mother    Cancer Father    Cancer Sister    Cancer Brother    Cancer Paternal Uncle    Breast cancer Neg Hx     SOCIAL HISTORY: Social History   Socioeconomic History   Marital status: Married    Spouse name: Sam   Number of children: 2   Years of education: 18   Highest education level: Not on file  Occupational History    Comment: [employer]  Tobacco Use   Smoking status: Never   Smokeless tobacco: Never  Vaping Use   Vaping Use: Never used  Substance and Sexual Activity   Alcohol use: No    Alcohol/week: 0.0 standard drinks of alcohol   Drug use: No   Sexual activity: Not on file  Other Topics Concern   Not on file  Social History Narrative   Patient consumes 4 -5 cups of caffeine daily.   Social Determinants of Health   Financial Resource Strain: Not on file  Food Insecurity: Not on file  Transportation Needs: Not on file  Physical Activity: Not on file  Stress: Not on file  Social Connections: Not on file  Intimate Partner Violence: Not on file     PHYSICAL EXAM  There were no vitals filed for this visit.  There is no height or weight on file to calculate BMI.  Generalized: Well developed, in no acute distress  Cardiology: normal rate and rhythm, no murmur noted Respiratory: clear to auscultation bilaterally  Neurological examination  Mentation: Alert oriented to time, place, history taking. Follows all commands speech and language fluent Cranial nerve II-XII: Pupils were equal round reactive to light. Extraocular movements were full, visual field were full  Motor: The motor testing reveals 5 over 5 strength of all 4 extremities. Good symmetric  motor tone is noted throughout.  Gait and station: Gait is normal.    DIAGNOSTIC DATA (LABS, IMAGING, TESTING) - I reviewed patient records, labs, notes, testing and imaging myself where available.      No data to display           Lab Results  Component Value Date   WBC 8.8 06/08/2014   HGB 11.0 (L) 06/08/2014   HCT 34.4 (L) 06/08/2014   MCV 81.7 06/08/2014   PLT 279 06/08/2014  Component Value Date/Time   NA 138 06/08/2014 0152   K 3.8 06/08/2014 0152   CL 107 06/08/2014 0152   CO2 27 06/08/2014 0152   GLUCOSE 183 (H) 06/08/2014 0152   BUN 13 06/08/2014 0152   CREATININE 0.86 06/08/2014 0152   CALCIUM 8.8 06/08/2014 0152   PROT 6.7 06/07/2014 2145   ALBUMIN 3.9 06/07/2014 2145   AST 21 06/07/2014 2145   ALT 17 06/07/2014 2145   ALKPHOS 48 06/07/2014 2145   BILITOT 0.5 06/07/2014 2145   GFRNONAA 71 (L) 06/08/2014 0152   GFRAA 82 (L) 06/08/2014 0152   No results found for: "CHOL", "HDL", "LDLCALC", "LDLDIRECT", "TRIG", "CHOLHDL" No results found for: "HGBA1C" No results found for: "VITAMINB12" No results found for: "TSH"   ASSESSMENT AND PLAN 72 y.o. year old female  has a past medical history of COVID-19 (10/25/2020), Diabetes ([place]), Hypercholesteremia, and Migraine. here with   No diagnosis found.    Miku Greving Mavis is doing well on CPAP therapy. Compliance report reveals good compliance. She was encouraged to continue using CPAP nightly and for greater than 4 hours each night. We will update supply orders and order a mask refitting at this time. We encouraged her to change her mask every month. Risks of untreated sleep apnea review and education materials provided. Healthy lifestyle habits encouraged. We discussed the Prevagen and felt this would be okay to try. She will follow up with her primary care if she feels her memory becomes a concern. She will follow up in 1 year, sooner if needed. She verbalizes understanding and agreement with this plan.     No orders of the defined types were placed in this encounter.    No orders of the defined types were placed in this encounter.     Debbora Presto, FNP-C 06/11/2022, 1:25 PM [facility] [address] [place], [place] [address] [phone]

## 2022-06-11 NOTE — Patient Instructions (Incomplete)
Please continue using your CPAP regularly. While your insurance requires that you use CPAP at least 4 hours each night on 70% of the nights, I recommend, that you not skip any nights and use it throughout the night if you can. Getting used to CPAP and staying with the treatment long term does take time and patience and discipline. Untreated obstructive sleep apnea when it is moderate to severe can have an adverse impact on cardiovascular health and raise her risk for heart disease, arrhythmias, hypertension, congestive heart failure, stroke and diabetes. Untreated obstructive sleep apnea causes sleep disruption, nonrestorative sleep, and sleep deprivation. This can have an impact on your day to day functioning and cause daytime sleepiness and impairment of cognitive function, memory loss, mood disturbance, and problems focussing. Using CPAP regularly can improve these symptoms.  Please continue to work with [employer] for supply needs.   DME: [facility] Phone: [phone]  Follow up in 1 year

## 2022-06-12 ENCOUNTER — Ambulatory Visit: Payer: Medicare PPO | Admitting: Family Medicine

## 2022-06-12 ENCOUNTER — Encounter: Payer: Self-pay | Admitting: Family Medicine

## 2022-06-12 VITALS — BP 130/68 | HR 75 | Ht 65.0 in | Wt 231.2 lb

## 2022-06-12 DIAGNOSIS — G4733 Obstructive sleep apnea (adult) (pediatric): Secondary | ICD-10-CM

## 2023-03-27 ENCOUNTER — Other Ambulatory Visit: Payer: Self-pay | Admitting: Family Medicine

## 2023-03-27 DIAGNOSIS — Z1231 Encounter for screening mammogram for malignant neoplasm of breast: Secondary | ICD-10-CM

## 2023-04-10 ENCOUNTER — Emergency Department (HOSPITAL_BASED_OUTPATIENT_CLINIC_OR_DEPARTMENT_OTHER): Payer: Medicare PPO | Admitting: Radiology

## 2023-04-10 ENCOUNTER — Other Ambulatory Visit: Payer: Self-pay

## 2023-04-10 ENCOUNTER — Emergency Department (HOSPITAL_BASED_OUTPATIENT_CLINIC_OR_DEPARTMENT_OTHER)
Admission: EM | Admit: 2023-04-10 | Discharge: 2023-04-11 | Disposition: A | Discharge status: Home / Self Care | Payer: Medicare PPO | Attending: Emergency Medicine | Admitting: Emergency Medicine

## 2023-04-10 DIAGNOSIS — Z7984 Long term (current) use of oral hypoglycemic drugs: Secondary | ICD-10-CM | POA: Insufficient documentation

## 2023-04-10 DIAGNOSIS — E119 Type 2 diabetes mellitus without complications: Secondary | ICD-10-CM | POA: Insufficient documentation

## 2023-04-10 DIAGNOSIS — R1013 Epigastric pain: Secondary | ICD-10-CM | POA: Diagnosis present

## 2023-04-10 DIAGNOSIS — Z8616 Personal history of COVID-19: Secondary | ICD-10-CM | POA: Insufficient documentation

## 2023-04-10 LAB — COMPREHENSIVE METABOLIC PANEL
ALT: 10 U/L (ref 0–44)
AST: 15 U/L (ref 15–41)
Albumin: 4.3 g/dL (ref 3.5–5.0)
Alkaline Phosphatase: 49 U/L (ref 38–126)
Anion gap: 8 (ref 5–15)
BUN: 14 mg/dL (ref 8–23)
CO2: 27 mmol/L (ref 22–32)
Calcium: 9.5 mg/dL (ref 8.9–10.3)
Chloride: 104 mmol/L (ref 98–111)
Creatinine, Ser: 0.98 mg/dL (ref 0.44–1.00)
GFR, Estimated: >60 mL/min (ref >60)
Glucose, Bld: 115 mg/dL — ABNORMAL HIGH (ref 70–99)
Potassium: 3.9 mmol/L (ref 3.5–5.1)
Sodium: 139 mmol/L (ref 135–145)
Total Bilirubin: 0.3 mg/dL (ref 0.0–1.2)
Total Protein: 6.8 g/dL (ref 6.5–8.1)

## 2023-04-10 LAB — CBC
HCT: 36.6 % (ref 36.0–46.0)
Hemoglobin: 11.7 g/dL — ABNORMAL LOW (ref 12.0–15.0)
MCH: 26.5 pg (ref 26.0–34.0)
MCHC: 32 g/dL (ref 30.0–36.0)
MCV: 83 fL (ref 80.0–100.0)
Platelets: 284 10*3/uL (ref 150–400)
RBC: 4.41 MIL/uL (ref 3.87–5.11)
RDW: 14.7 % (ref 11.5–15.5)
WBC: 7.4 10*3/uL (ref 4.0–10.5)
nRBC: 0 % (ref 0.0–0.2)

## 2023-04-10 LAB — LIPASE, BLOOD: Lipase: 27 U/L (ref 11–51)

## 2023-04-10 LAB — TROPONIN I (HIGH SENSITIVITY)
Troponin I (High Sensitivity): 5 ng/L (ref <18)
Troponin I (High Sensitivity): 5 ng/L

## 2023-04-10 NOTE — ED Provider Notes (Signed)
 [place] EMERGENCY DEPARTMENT AT [facility] Provider Note  CSN: [id number] Arrival date & time: 04/10/23 1932  Chief Complaint(s) No chief complaint on file.  HPI Natalie Vincent is a 73 y.o. female    The history is provided by the patient.  Abdominal Pain Pain location:  Epigastric Pain quality: sharp   Pain radiates to:  Chest Duration: 15 mins. Timing:  Constant Progression:  Resolved Chronicity:  New Relieved by: self resolved. Worsened by:  Deep breathing Associated symptoms: no chills, no cough, no fever, no nausea, no shortness of breath and no vomiting     Past Medical History Past Medical History:  Diagnosis Date   COVID-19 10/25/2020   Diabetes [facility])    Hypercholesteremia    Migraine    Patient Active Problem List   Diagnosis Date Noted   Syncope 06/08/2014   Diabetes mellitus (HCC) 06/08/2014   Migraine 06/08/2014   Home Medication(s) Prior to Admission medications   Medication Sig Start Date End Date Taking? Authorizing Provider  amLODipine  (NORVASC ) 5 MG tablet Take 1 tablet (5 mg total) by mouth daily. 10/22/21 01/20/22  Ladona Heinz, MD  atorvastatin  (LIPITOR) 20 MG tablet Take 1 tablet by mouth daily. 04/14/13   [provider]  cholecalciferol (VITAMIN D3) 25 MCG (1000 UNIT) tablet Three times a week    [provider]  Cyanocobalamin (VITAMIN B 12 PO) Take by mouth.    [provider]  levothyroxine  (SYNTHROID ) 137 MCG tablet Take by mouth. 06/29/20   [provider]  METFORMIN HCL ER, OSM, PO Take 500mg  in AM and 1000mg  in PM    [provider]  nitroGLYCERIN  (NITROSTAT ) 0.4 MG SL tablet Place 1 tablet (0.4 mg total) under the tongue every 5 (five) minutes as needed for up to 25 days for chest pain. 10/22/21 12/12/21  Ladona Heinz, MD  omeprazole (PRILOSEC) 40 MG capsule Take 40 mg by mouth daily. 09/28/20   [provider]  valsartan (DIOVAN) 160 MG tablet Take 160 mg by mouth daily. 10/18/20    [provider]                                                                                                                                    Allergies Elemental sulfur  Review of Systems Review of Systems  Constitutional:  Negative for chills and fever.  Respiratory:  Negative for cough and shortness of breath.   Gastrointestinal:  Positive for abdominal pain. Negative for nausea and vomiting.   As noted in HPI  Physical Exam Vital Signs  I have reviewed the triage vital signs BP (!) 140/57 (BP Location: Left Arm)   Pulse 61   Temp 97.6 F (36.4 C) (Oral)   Resp 18   SpO2 99%   Physical Exam Vitals reviewed.  Constitutional:      General: She is not in acute distress.    Appearance: She is  well-developed. She is not diaphoretic.  HENT:     Head: Normocephalic and atraumatic.     Nose: Nose normal.  Eyes:     General: No scleral icterus.       Right eye: No discharge.        Left eye: No discharge.     Conjunctiva/sclera: Conjunctivae normal.     Pupils: Pupils are equal, round, and reactive to light.  Cardiovascular:     Rate and Rhythm: Normal rate and regular rhythm.     Heart sounds: No murmur heard.    No friction rub. No gallop.  Pulmonary:     Effort: Pulmonary effort is normal. No respiratory distress.     Breath sounds: Normal breath sounds. No stridor. No rales.  Abdominal:     General: There is no distension.     Palpations: Abdomen is soft.     Tenderness: There is no abdominal tenderness.  Musculoskeletal:        General: No tenderness.     Cervical back: Normal range of motion and neck supple.  Skin:    General: Skin is warm and dry.     Findings: No erythema or rash.  Neurological:     Mental Status: She is alert and oriented to person, place, and time.     ED Results and Treatments Labs (all labs ordered are listed, but only abnormal results are displayed) Labs Reviewed  CBC - Abnormal; Notable for the following components:       Result Value   Hemoglobin 11.7 (*)    All other components within normal limits  COMPREHENSIVE METABOLIC PANEL - Abnormal; Notable for the following components:   Glucose, Bld 115 (*)    All other components within normal limits  D-DIMER, QUANTITATIVE - Abnormal; Notable for the following components:   D-Dimer, Quant 0.58 (*)    All other components within normal limits  LIPASE, BLOOD  TROPONIN I (HIGH SENSITIVITY)  TROPONIN I (HIGH SENSITIVITY)                                                                                                                         EKG  EKG Interpretation Date/Time:  Thursday April 10 2023 19:50:34 EST Ventricular Rate:  68 PR Interval:  130 QRS Duration:  80 QT Interval:  382 QTC Calculation: 406 R Axis:   83  Text Interpretation: Normal sinus rhythm Normal ECG When compared with ECG of 07-Jun-2014 22:31, PREVIOUS ECG IS PRESENT No significant change was found Confirmed by Trine Likes [id number]) on 04/10/2023 10:58:46 PM       Radiology DG Chest 2 View Result Date: 04/10/2023 CLINICAL DATA:  Chest pain. EXAM: CHEST - 2 VIEW COMPARISON:  10/21/2019 FINDINGS: The cardiomediastinal contours are normal. The lungs are clear. Pulmonary vasculature is normal. No consolidation, pleural effusion, or pneumothorax. Thoracic spondylosis. No acute osseous abnormalities are seen. Lower cervical spine hardware is partially included in the field of view IMPRESSION: No acute chest  findings. Electronically Signed   By: Andrea Gasman M.D.   On: 04/10/2023 23:56    Medications Ordered in ED Medications - No data to display Procedures Procedures  (including critical care time) Medical Decision Making / ED Course   Medical Decision Making Amount and/or Complexity of Data Reviewed Labs: ordered. Decision-making details documented in ED Course. Radiology: ordered and independent interpretation performed. Decision-making details documented in ED  Course. ECG/medicine tests: ordered and independent interpretation performed. Decision-making details documented in ED Course.  Risk Decision regarding hospitalization.    Abdominal pain radiating to the chest differential diagnosis considered:  Highly atypical for ACS.  EKG without acute ischemic changes or evidence of pericarditis.  Serial troponins negative x 2.  Given the complete resolution of her pain, suspicion for aortic dissection is low.  Not classic for esophageal perforation.  On my read of the chest x-ray, there was no evidence suggestive of pneumonia, pneumothorax, pneumomediastinum, pulmonary edema concerning for new or exacerbation of heart failure, abnormal contour of the mediastinum to suggest dissection, and no evidence of acute injuries.  CBC without leukocytosis or anemia.   Metabolic panel without significant electrolyte derangements or renal sufficiency.  No evidence of bili obstruction or pancreatitis.  Low suspicion for pulmonary embolism but unable to [facility] patient due to age, dimer slightly elevated but well below age-adjusted level. Doubt PE.  Doubt serious intra-abdominal inflammatory/infectious process requiring imaging at this time.  Patient remained asymptomatic for almost 6 hours.     Final Clinical Impression(s) / ED Diagnoses Final diagnoses:  Epigastric pain   The patient appears reasonably screened and/or stabilized for discharge and I doubt any other medical condition or other [facility] requiring further screening, evaluation, or treatment in the ED at this time. I have discussed the findings, Dx and Tx plan with the patient/family who expressed understanding and agree(s) with the plan. Discharge instructions discussed at length. The patient/family was given strict return precautions who verbalized understanding of the instructions. No further questions at time of discharge.  Disposition: Discharge  Condition: Good  ED Discharge Orders     None         Follow Up: Jolee Madelin Patch, MD [address] [place] [address] [phone]  Call  to schedule an appointment for close follow up    This chart was dictated using voice recognition software.  Despite best efforts to proofread,  errors can occur which can change the documentation meaning.    Trine Raynell Moder, MD 04/11/23 0120

## 2023-04-10 NOTE — ED Triage Notes (Signed)
 Experienced sudden onset of central CP/indigestion while driving. Belching in triage.

## 2023-04-10 NOTE — ED Notes (Signed)
 Pt denies CP at this time

## 2023-04-11 LAB — D-DIMER, QUANTITATIVE: D-Dimer, Quant: 0.58 ug{FEU}/mL — ABNORMAL HIGH (ref 0.00–0.50)

## 2023-04-23 ENCOUNTER — Ambulatory Visit
Admission: RE | Admit: 2023-04-23 | Discharge: 2023-04-23 | Disposition: A | Discharge status: Home / Self Care | Payer: Medicare PPO | Source: Ambulatory Visit | Attending: Family Medicine | Admitting: Family Medicine

## 2023-04-23 DIAGNOSIS — Z1231 Encounter for screening mammogram for malignant neoplasm of breast: Secondary | ICD-10-CM

## 2023-04-25 ENCOUNTER — Other Ambulatory Visit: Payer: Self-pay | Admitting: Family Medicine

## 2023-04-25 DIAGNOSIS — R928 Other abnormal and inconclusive findings on diagnostic imaging of breast: Secondary | ICD-10-CM

## 2023-05-03 ENCOUNTER — Ambulatory Visit
Admission: RE | Admit: 2023-05-03 | Discharge: 2023-05-03 | Disposition: A | Discharge status: Home / Self Care | Payer: Medicare PPO | Source: Ambulatory Visit | Attending: Family Medicine | Admitting: Family Medicine

## 2023-05-03 ENCOUNTER — Other Ambulatory Visit: Payer: Self-pay | Admitting: Family Medicine

## 2023-05-03 DIAGNOSIS — N6320 Unspecified lump in the left breast, unspecified quadrant: Secondary | ICD-10-CM

## 2023-05-03 DIAGNOSIS — R928 Other abnormal and inconclusive findings on diagnostic imaging of breast: Secondary | ICD-10-CM

## 2023-05-08 ENCOUNTER — Ambulatory Visit
Admission: RE | Admit: 2023-05-08 | Discharge: 2023-05-08 | Disposition: A | Discharge status: Home / Self Care | Payer: Medicare PPO | Source: Ambulatory Visit | Attending: Family Medicine | Admitting: Family Medicine

## 2023-05-08 DIAGNOSIS — R928 Other abnormal and inconclusive findings on diagnostic imaging of breast: Secondary | ICD-10-CM

## 2023-05-08 DIAGNOSIS — N6320 Unspecified lump in the left breast, unspecified quadrant: Secondary | ICD-10-CM

## 2023-05-08 HISTORY — PX: BREAST BIOPSY: SHX20

## 2023-05-09 LAB — SURGICAL PATHOLOGY

## 2023-05-21 ENCOUNTER — Other Ambulatory Visit: Payer: Medicare PPO

## 2023-05-21 ENCOUNTER — Telehealth: Payer: Self-pay | Admitting: Family Medicine

## 2023-05-21 NOTE — Telephone Encounter (Signed)
appointment r/s due to conflict

## 2023-06-12 ENCOUNTER — Ambulatory Visit: Payer: Medicare PPO | Admitting: Family Medicine

## 2023-07-17 NOTE — Progress Notes (Signed)
 PATIENT: Natalie Vincent DOB: [birth year]  REASON FOR VISIT: follow up HISTORY FROM: patient  Chief Complaint  Patient presents with   Follow-up    Pt in room 2. Alone. Here for cpap follow up. Pt reports right nostril is always congested. Pt reports she doesn't sleep well, frequent bathroom breaks.     HISTORY OF PRESENT ILLNESS:  07/21/23 ALL:  Natalie Vincent returns for follow up for OSA on CPAP. She continues to do well. She is using therapy most every night. She does have right sided nasal congestion, often. She continues to have dry mouth. She is using Biotene products that help. She is using an Airfit F20 FFM. May be interested in modified FFM. She is sleeping well. She may nod off if sitting still.   Resmed down during visit. No compliance report to review. She will bring card and machine back next week to review download.   06/12/2022 ALL:  Natalie Vincent returns for follow up for OSA on CPAP. She continues to do fairly well but continues to adjust to therapy. She changed masks. She is currently using a Airfit F20 medium FFM. She likes it better than previous mask but continues to fight with fit of mask and headgear. She reports husband tells her she does not snore when using CPAP. She is having more congestion. Occasional dry mouth.     06/06/2021 ALL: Natalie Vincent is a 73 y.o. female here today for follow up for OSA on CPAP. She was seen in consult with Dr Omar Bibber 11/20/2020. Repeat HST confirmed OSA with total AHI of 16.4/hr and O2 nadir of 87%. AutoPAP ordered. Her download below demonstrates great compliance for days used and for greater than 4 hours. She reports that she has some issues with her mask not fitting well. She has not replaced her mask in the past 3 months. She does have a significant leak in the 95th percentile 23.1 L/min. Her AHI is 3.3 on 6 -11 cmH20.  She has some question about Prevagen for some minor word finding concerns.     HISTORY: (copied from Dr Dail Drought  previous note)  Dear Dr. Suszanne Eriksson,  I saw your patient Natalie Vincent, upon your kind request in my sleep clinic today for reevaluation of her sleep apnea.  The patient is unaccompanied today.  As you know, Natalie Vincent is a 73 year old right-handed woman with an underlying medical history of diabetes, hyperlipidemia, hypothyroidism, thyroid cancer with status post thyroidectomy, reflux disease, allergic rhinitis, vitamin D deficiency, and obesity, who reports that she has not been using her CPAP machine for the past nearly 2-1/2 years.  She stopped using her machine before the pandemic.  She reports that she received new supplies but there was a moldy smell to the supplies so she was scared to use it.  She has had some weight fluctuation, currently about 14 pounds less than what she weighed in 2015 when she had a sleep study with us .  Her split-night sleep study from 10/16/2013 indicated severe sleep apnea with a baseline AHI of 38.7/h, O2 nadir 82%.  She was placed on CPAP of 9 cm as she did well with it at the time.  Her DME company is [employer].  She reports not sleeping well.  She has daytime somnolence and wakes up multiple times to go to the bathroom.  She tries to be in bed around 11 and rise time is currently around 8.  She has nocturia about 4-5 times per average night and  has woken up with a headache.  She does not feel rested.  She has had recent stress due to her husband's fall and hip fracture.  He had a hip replacement.   She is a non-smoker and drinks caffeine in the form of coffee, about 2 cups in the mornings.  She does not currently drink any alcohol.  They have 1 dog in the household.  Her Epworth sleepiness score is 11 out of 24, fatigue severity score is 36 out of 63.   REVIEW OF SYSTEMS: Out of a complete 14 system review of symptoms, the patient complains only of the following symptoms, seasonal allergies, and all other reviewed systems are negative.  ESS: 11/24,  previously 10  ALLERGIES: Allergies  Allergen Reactions   Elemental Sulfur     Reaction unknown    HOME MEDICATIONS: Outpatient Medications Prior to Visit  Medication Sig Dispense Refill   atorvastatin (LIPITOR) 20 MG tablet Take 1 tablet by mouth daily.     cholecalciferol (VITAMIN D3) 25 MCG (1000 UNIT) tablet Three times a week     Cyanocobalamin (VITAMIN B 12 PO) Take by mouth.     levothyroxine (SYNTHROID) 137 MCG tablet Take by mouth.     METFORMIN HCL ER, OSM, PO Take 500mg  in AM and 1000mg  in PM     omeprazole (PRILOSEC) 40 MG capsule Take 40 mg by mouth daily.     valsartan (DIOVAN) 160 MG tablet Take 160 mg by mouth daily.     amLODipine (NORVASC) 5 MG tablet Take 1 tablet (5 mg total) by mouth daily. 30 tablet 2   nitroGLYCERIN (NITROSTAT) 0.4 MG SL tablet Place 1 tablet (0.4 mg total) under the tongue every 5 (five) minutes as needed for up to 25 days for chest pain. 25 tablet 3   No facility-administered medications prior to visit.    PAST MEDICAL HISTORY: Past Medical History:  Diagnosis Date   COVID-19 10/25/2020   Diabetes (HCC)    Hypercholesteremia    Migraine     PAST SURGICAL HISTORY: Past Surgical History:  Procedure Laterality Date   ABDOMINAL HYSTERECTOMY     BREAST BIOPSY Left 05/08/2023   [place] LT BREAST BX W LOC DEV 1ST LESION IMG BX SPEC US GUIDE 05/08/2023 [facility]   CERVICAL DISC SURGERY     titanium rod    FAMILY HISTORY: Family History  Problem Relation Age of Onset   Diabetes Mother    Kidney failure Mother    Cancer Father    Cancer Sister    Cancer Paternal Uncle    Cancer Brother    Breast cancer Neg Hx    BRCA 1/2 Neg Hx     SOCIAL HISTORY: Social History   Socioeconomic History   Marital status: Married    Spouse name: Sam   Number of children: 2   Years of education: 18   Highest education level: Not on file  Occupational History    Comment: [employer]  Tobacco Use   Smoking status: Never   Smokeless  tobacco: Never  Vaping Use   Vaping status: Never Used  Substance and Sexual Activity   Alcohol use: No    Alcohol/week: 0.0 standard drinks of alcohol   Drug use: No   Sexual activity: Not on file  Other Topics Concern   Not on file  Social History Narrative   Patient consumes 4 -5 cups of caffeine daily.   Social Drivers of Corporate investment banker Strain: Not  on file  Food Insecurity: Low Risk  (04/14/2023)   Received from [facility]   Hunger Vital Sign    Worried About Running Out of Food in the Last Year: Never true    Ran Out of Food in the Last Year: Never true  Transportation Needs: No Transportation Needs (04/14/2023)   Received from [facility]    In the past 12 months, has lack of reliable transportation kept you from medical appointments, meetings, work or from getting things needed for daily living? : No  Physical Activity: Not on file  Stress: Not on file  Social Connections: Not on file  Intimate Partner Violence: Not on file     PHYSICAL EXAM  Vitals:   07/21/23 1033  BP: (!) 127/54  Pulse: 77  Weight: 213 lb 9.6 oz (96.9 kg)  Height: 5\' 5"  (1.651 m)     Body mass index is 35.54 kg/m.  Generalized: Well developed, in no acute distress  Cardiology: normal rate and rhythm, no murmur noted Respiratory: clear to auscultation bilaterally  Neurological examination  Mentation: Alert oriented to time, place, history taking. Follows all commands speech and language fluent Cranial nerve II-XII: Pupils were equal round reactive to light. Extraocular movements were full, visual field were full  Motor: The motor testing reveals 5 over 5 strength of all 4 extremities. Good symmetric motor tone is noted throughout.  Gait and station: Gait is normal.    DIAGNOSTIC DATA (LABS, IMAGING, TESTING) - I reviewed patient records, labs, notes, testing and imaging myself where available.      No data to display           Lab Results   Component Value Date   WBC 7.4 04/10/2023   HGB 11.7 (L) 04/10/2023   HCT 36.6 04/10/2023   MCV 83.0 04/10/2023   PLT 284 04/10/2023      Component Value Date/Time   NA 139 04/10/2023 1946   K 3.9 04/10/2023 1946   CL 104 04/10/2023 1946   CO2 27 04/10/2023 1946   GLUCOSE 115 (H) 04/10/2023 1946   BUN 14 04/10/2023 1946   CREATININE 0.98 04/10/2023 1946   CALCIUM 9.5 04/10/2023 1946   PROT 6.8 04/10/2023 1946   ALBUMIN 4.3 04/10/2023 1946   AST 15 04/10/2023 1946   ALT 10 04/10/2023 1946   ALKPHOS 49 04/10/2023 1946   BILITOT 0.3 04/10/2023 1946   GFRNONAA >60 04/10/2023 1946   GFRAA 82 (L) 06/08/2014 0152   No results found for: "CHOL", "HDL", "LDLCALC", "LDLDIRECT", "TRIG", "CHOLHDL" No results found for: "HGBA1C" No results found for: "VITAMINB12" No results found for: "TSH"   ASSESSMENT AND PLAN 73 y.o. year old female  has a past medical history of COVID-19 (10/25/2020), Diabetes (HCC), Hypercholesteremia, and Migraine. here with     ICD-10-CM   1. OSA on CPAP  G47.33 For home use only DME continuous positive airway pressure (CPAP)        BRONDA ALFRED is doing fairly well on CPAP therapy. Compliance report not available during today's visit. She will bring her machine back to the office next week and we will attempt download. We will send orders for mask refit. May consider modified FFM due to nasal congestion from pressure of current FFM. She was encouraged to continue using CPAP nightly and for greater than 4 hours each night. We will update supply orders. She was encouraged to continue working with DME if unable to adjust to current mask. Risks  of untreated sleep apnea review and education materials provided. Healthy lifestyle habits encouraged. She will follow up in 1 year, sooner if needed. She verbalizes understanding and agreement with this plan.    Orders Placed This Encounter  Procedures   For home use only DME continuous positive airway pressure  (CPAP)    Mask refitting please for nasal congestion, now using F20. Heated Humidity with all supplies as needed    Length of Need:   Lifetime    Patient has OSA or probable OSA:   Yes    Is the patient currently using CPAP in the home:   Yes    Settings:   Other see comments    CPAP supplies needed:   Mask, headgear, cushions, filters, heated tubing and water chamber     No orders of the defined types were placed in this encounter.     Terrilyn Fick, FNP-C 07/21/2023, 11:21 AM [facility] [address], [address] [place], [place] [address] [phone]

## 2023-07-17 NOTE — Patient Instructions (Addendum)
 Please continue using your CPAP regularly. While your insurance requires that you use CPAP at least 4 hours each night on 70% of the nights, I recommend, that you not skip any nights and use it throughout the night if you can. Getting used to CPAP and staying with the treatment long term does take time and patience and discipline. Untreated obstructive sleep apnea when it is moderate to severe can have an adverse impact on cardiovascular health and raise her risk for heart disease, arrhythmias, hypertension, congestive heart failure, stroke and diabetes. Untreated obstructive sleep apnea causes sleep disruption, nonrestorative sleep, and sleep deprivation. This can have an impact on your day to day functioning and cause daytime sleepiness and impairment of cognitive function, memory loss, mood disturbance, and problems focussing. Using CPAP regularly can improve these symptoms.  We will update supply orders, today. Bring your machine back by the office one day next week and well check download to make sure apnea is well managed.   DME: [facility] Phone: [phone]  Follow up in 1 year

## 2023-07-21 ENCOUNTER — Ambulatory Visit: Payer: Medicare PPO | Admitting: Family Medicine

## 2023-07-21 ENCOUNTER — Encounter: Payer: Self-pay | Admitting: Family Medicine

## 2023-07-21 VITALS — BP 127/54 | HR 77 | Ht 65.0 in | Wt 213.6 lb

## 2023-07-21 DIAGNOSIS — G4733 Obstructive sleep apnea (adult) (pediatric): Secondary | ICD-10-CM | POA: Diagnosis not present

## 2023-07-29 ENCOUNTER — Telehealth: Payer: Self-pay | Admitting: Family Medicine

## 2023-07-29 NOTE — Telephone Encounter (Signed)
 Pt said bring in CPAP for nurse to download from chip and also request a copy of last office visit.

## 2023-07-29 NOTE — Telephone Encounter (Signed)
 Spoke to patient going to bring pap machine by tomorrow to get DL

## 2023-07-30 ENCOUNTER — Encounter: Payer: Self-pay | Admitting: Family Medicine

## 2023-07-30 NOTE — Telephone Encounter (Signed)
 Natalie Vincent patient cpap download for 30 day and 90 day are below. The [facility] system was down when patient came for office visit on 07/21/23.

## 2023-07-30 NOTE — Telephone Encounter (Signed)
 Natalie Vincent

## 2023-09-12 ENCOUNTER — Other Ambulatory Visit: Payer: Self-pay | Admitting: Family Medicine

## 2023-09-12 DIAGNOSIS — N63 Unspecified lump in unspecified breast: Secondary | ICD-10-CM

## 2023-11-06 ENCOUNTER — Ambulatory Visit
Admission: RE | Admit: 2023-11-06 | Discharge: 2023-11-06 | Disposition: A | Discharge status: Home / Self Care | Source: Ambulatory Visit | Attending: Family Medicine

## 2023-11-06 DIAGNOSIS — N63 Unspecified lump in unspecified breast: Secondary | ICD-10-CM

## 2024-03-26 ENCOUNTER — Other Ambulatory Visit: Payer: Self-pay | Admitting: Family Medicine

## 2024-03-26 DIAGNOSIS — Z1231 Encounter for screening mammogram for malignant neoplasm of breast: Secondary | ICD-10-CM

## 2024-04-23 ENCOUNTER — Ambulatory Visit
Admission: RE | Admit: 2024-04-23 | Discharge: 2024-04-23 | Disposition: A | Discharge status: Home / Self Care | Source: Ambulatory Visit | Attending: Family Medicine | Admitting: Family Medicine

## 2024-04-23 DIAGNOSIS — Z1231 Encounter for screening mammogram for malignant neoplasm of breast: Secondary | ICD-10-CM

## 2024-07-27 ENCOUNTER — Ambulatory Visit: Admitting: Family Medicine
